# Patient Record
Sex: Female | Born: 1935
Health system: Southern US, Community
[De-identification: ages and names within clinical notes are randomized; demographics above are authoritative.]

## PROBLEM LIST (undated history)

## (undated) DIAGNOSIS — E039 Hypothyroidism, unspecified: Secondary | ICD-10-CM

## (undated) DIAGNOSIS — I10 Essential (primary) hypertension: Secondary | ICD-10-CM

## (undated) DIAGNOSIS — Z8679 Personal history of other diseases of the circulatory system: Secondary | ICD-10-CM

## (undated) DIAGNOSIS — I2510 Atherosclerotic heart disease of native coronary artery without angina pectoris: Secondary | ICD-10-CM

## (undated) DIAGNOSIS — E785 Hyperlipidemia, unspecified: Secondary | ICD-10-CM

## (undated) HISTORY — PX: INSERT / REPLACE / REMOVE PACEMAKER: SUR710

## (undated) HISTORY — PX: BREAST LUMPECTOMY: SHX2

## (undated) HISTORY — PX: CARDIAC CATHETERIZATION: SHX172

## (undated) HISTORY — PX: ABDOMINAL HYSTERECTOMY: SHX81

## (undated) HISTORY — PX: CHOLECYSTECTOMY: SHX55

---

## 2009-02-26 HISTORY — PX: OTHER SURGICAL HISTORY: SHX169

## 2014-03-04 DIAGNOSIS — E039 Hypothyroidism, unspecified: Secondary | ICD-10-CM | POA: Diagnosis not present

## 2014-03-04 DIAGNOSIS — M25552 Pain in left hip: Secondary | ICD-10-CM | POA: Diagnosis not present

## 2014-03-05 DIAGNOSIS — Z969 Presence of functional implant, unspecified: Secondary | ICD-10-CM | POA: Diagnosis not present

## 2014-03-05 DIAGNOSIS — M7062 Trochanteric bursitis, left hip: Secondary | ICD-10-CM | POA: Diagnosis not present

## 2014-03-29 DIAGNOSIS — H3531 Nonexudative age-related macular degeneration: Secondary | ICD-10-CM | POA: Diagnosis not present

## 2014-05-06 DIAGNOSIS — I440 Atrioventricular block, first degree: Secondary | ICD-10-CM | POA: Diagnosis not present

## 2014-08-06 DIAGNOSIS — Z45010 Encounter for checking and testing of cardiac pacemaker pulse generator [battery]: Secondary | ICD-10-CM | POA: Diagnosis not present

## 2014-08-06 DIAGNOSIS — I498 Other specified cardiac arrhythmias: Secondary | ICD-10-CM | POA: Diagnosis not present

## 2014-11-08 DIAGNOSIS — Z45018 Encounter for adjustment and management of other part of cardiac pacemaker: Secondary | ICD-10-CM | POA: Diagnosis not present

## 2014-11-08 DIAGNOSIS — I498 Other specified cardiac arrhythmias: Secondary | ICD-10-CM | POA: Diagnosis not present

## 2014-12-09 DIAGNOSIS — S060X9A Concussion with loss of consciousness of unspecified duration, initial encounter: Secondary | ICD-10-CM | POA: Diagnosis not present

## 2014-12-09 DIAGNOSIS — Z950 Presence of cardiac pacemaker: Secondary | ICD-10-CM | POA: Diagnosis not present

## 2014-12-09 DIAGNOSIS — W19XXXA Unspecified fall, initial encounter: Secondary | ICD-10-CM | POA: Diagnosis not present

## 2014-12-09 DIAGNOSIS — S0191XA Laceration without foreign body of unspecified part of head, initial encounter: Secondary | ICD-10-CM | POA: Diagnosis not present

## 2014-12-09 DIAGNOSIS — Z452 Encounter for adjustment and management of vascular access device: Secondary | ICD-10-CM | POA: Diagnosis not present

## 2014-12-09 DIAGNOSIS — I2510 Atherosclerotic heart disease of native coronary artery without angina pectoris: Secondary | ICD-10-CM | POA: Diagnosis not present

## 2014-12-09 DIAGNOSIS — Z79899 Other long term (current) drug therapy: Secondary | ICD-10-CM | POA: Diagnosis not present

## 2014-12-09 DIAGNOSIS — S0101XA Laceration without foreign body of scalp, initial encounter: Secondary | ICD-10-CM | POA: Diagnosis not present

## 2014-12-09 DIAGNOSIS — Z7982 Long term (current) use of aspirin: Secondary | ICD-10-CM | POA: Diagnosis not present

## 2014-12-09 DIAGNOSIS — Z043 Encounter for examination and observation following other accident: Secondary | ICD-10-CM | POA: Diagnosis not present

## 2014-12-09 DIAGNOSIS — Z041 Encounter for examination and observation following transport accident: Secondary | ICD-10-CM | POA: Diagnosis not present

## 2014-12-09 DIAGNOSIS — R55 Syncope and collapse: Secondary | ICD-10-CM | POA: Diagnosis not present

## 2014-12-09 DIAGNOSIS — Z23 Encounter for immunization: Secondary | ICD-10-CM | POA: Diagnosis not present

## 2014-12-09 DIAGNOSIS — N390 Urinary tract infection, site not specified: Secondary | ICD-10-CM | POA: Diagnosis not present

## 2014-12-10 DIAGNOSIS — W19XXXA Unspecified fall, initial encounter: Secondary | ICD-10-CM | POA: Diagnosis not present

## 2014-12-10 DIAGNOSIS — I2510 Atherosclerotic heart disease of native coronary artery without angina pectoris: Secondary | ICD-10-CM | POA: Diagnosis not present

## 2014-12-10 DIAGNOSIS — S060X9A Concussion with loss of consciousness of unspecified duration, initial encounter: Secondary | ICD-10-CM | POA: Diagnosis not present

## 2014-12-10 DIAGNOSIS — S0191XA Laceration without foreign body of unspecified part of head, initial encounter: Secondary | ICD-10-CM | POA: Diagnosis not present

## 2014-12-10 DIAGNOSIS — Z79899 Other long term (current) drug therapy: Secondary | ICD-10-CM | POA: Diagnosis not present

## 2014-12-10 DIAGNOSIS — N390 Urinary tract infection, site not specified: Secondary | ICD-10-CM | POA: Diagnosis not present

## 2014-12-10 DIAGNOSIS — Z7982 Long term (current) use of aspirin: Secondary | ICD-10-CM | POA: Diagnosis not present

## 2014-12-10 DIAGNOSIS — Z23 Encounter for immunization: Secondary | ICD-10-CM | POA: Diagnosis not present

## 2014-12-10 DIAGNOSIS — Z950 Presence of cardiac pacemaker: Secondary | ICD-10-CM | POA: Diagnosis not present

## 2014-12-10 DIAGNOSIS — R55 Syncope and collapse: Secondary | ICD-10-CM | POA: Diagnosis not present

## 2014-12-14 DIAGNOSIS — S060X9A Concussion with loss of consciousness of unspecified duration, initial encounter: Secondary | ICD-10-CM | POA: Diagnosis not present

## 2014-12-14 DIAGNOSIS — N390 Urinary tract infection, site not specified: Secondary | ICD-10-CM | POA: Diagnosis not present

## 2014-12-22 DIAGNOSIS — H353130 Nonexudative age-related macular degeneration, bilateral, stage unspecified: Secondary | ICD-10-CM | POA: Diagnosis not present

## 2015-02-07 DIAGNOSIS — Z45018 Encounter for adjustment and management of other part of cardiac pacemaker: Secondary | ICD-10-CM | POA: Diagnosis not present

## 2015-02-07 DIAGNOSIS — I498 Other specified cardiac arrhythmias: Secondary | ICD-10-CM | POA: Diagnosis not present

## 2015-02-09 DIAGNOSIS — E039 Hypothyroidism, unspecified: Secondary | ICD-10-CM | POA: Diagnosis not present

## 2015-02-09 DIAGNOSIS — Z1389 Encounter for screening for other disorder: Secondary | ICD-10-CM | POA: Diagnosis not present

## 2015-02-09 DIAGNOSIS — K2970 Gastritis, unspecified, without bleeding: Secondary | ICD-10-CM | POA: Diagnosis not present

## 2015-02-09 DIAGNOSIS — Z0000 Encounter for general adult medical examination without abnormal findings: Secondary | ICD-10-CM | POA: Diagnosis not present

## 2015-02-09 DIAGNOSIS — Z9181 History of falling: Secondary | ICD-10-CM | POA: Diagnosis not present

## 2015-05-19 DIAGNOSIS — I495 Sick sinus syndrome: Secondary | ICD-10-CM | POA: Insufficient documentation

## 2015-05-19 DIAGNOSIS — Z45018 Encounter for adjustment and management of other part of cardiac pacemaker: Secondary | ICD-10-CM | POA: Insufficient documentation

## 2015-05-19 DIAGNOSIS — R55 Syncope and collapse: Secondary | ICD-10-CM

## 2015-05-19 DIAGNOSIS — Z45010 Encounter for checking and testing of cardiac pacemaker pulse generator [battery]: Secondary | ICD-10-CM | POA: Diagnosis not present

## 2015-05-19 HISTORY — DX: Sick sinus syndrome: I49.5

## 2015-05-19 HISTORY — DX: Syncope and collapse: R55

## 2015-05-19 HISTORY — DX: Encounter for adjustment and management of other part of cardiac pacemaker: Z45.018

## 2015-06-19 DIAGNOSIS — I2510 Atherosclerotic heart disease of native coronary artery without angina pectoris: Secondary | ICD-10-CM

## 2015-06-19 DIAGNOSIS — E785 Hyperlipidemia, unspecified: Secondary | ICD-10-CM

## 2015-06-19 HISTORY — DX: Hyperlipidemia, unspecified: E78.5

## 2015-06-19 HISTORY — DX: Atherosclerotic heart disease of native coronary artery without angina pectoris: I25.10

## 2015-06-20 DIAGNOSIS — E785 Hyperlipidemia, unspecified: Secondary | ICD-10-CM | POA: Diagnosis not present

## 2015-06-20 DIAGNOSIS — I25119 Atherosclerotic heart disease of native coronary artery with unspecified angina pectoris: Secondary | ICD-10-CM | POA: Diagnosis not present

## 2015-06-20 DIAGNOSIS — I10 Essential (primary) hypertension: Secondary | ICD-10-CM | POA: Diagnosis not present

## 2015-06-20 DIAGNOSIS — R0602 Shortness of breath: Secondary | ICD-10-CM | POA: Diagnosis not present

## 2015-06-20 DIAGNOSIS — I495 Sick sinus syndrome: Secondary | ICD-10-CM | POA: Diagnosis not present

## 2015-06-20 DIAGNOSIS — Z45018 Encounter for adjustment and management of other part of cardiac pacemaker: Secondary | ICD-10-CM | POA: Diagnosis not present

## 2015-06-27 DIAGNOSIS — R0602 Shortness of breath: Secondary | ICD-10-CM | POA: Diagnosis not present

## 2015-06-27 DIAGNOSIS — I25119 Atherosclerotic heart disease of native coronary artery with unspecified angina pectoris: Secondary | ICD-10-CM | POA: Diagnosis not present

## 2015-08-08 DIAGNOSIS — Z950 Presence of cardiac pacemaker: Secondary | ICD-10-CM | POA: Diagnosis not present

## 2015-08-29 DIAGNOSIS — H353131 Nonexudative age-related macular degeneration, bilateral, early dry stage: Secondary | ICD-10-CM | POA: Diagnosis not present

## 2015-11-08 DIAGNOSIS — Z45018 Encounter for adjustment and management of other part of cardiac pacemaker: Secondary | ICD-10-CM | POA: Diagnosis not present

## 2015-11-21 DIAGNOSIS — R079 Chest pain, unspecified: Secondary | ICD-10-CM | POA: Diagnosis not present

## 2015-11-21 DIAGNOSIS — R0602 Shortness of breath: Secondary | ICD-10-CM | POA: Diagnosis not present

## 2015-11-21 DIAGNOSIS — R05 Cough: Secondary | ICD-10-CM | POA: Diagnosis not present

## 2015-11-21 DIAGNOSIS — Z45018 Encounter for adjustment and management of other part of cardiac pacemaker: Secondary | ICD-10-CM | POA: Diagnosis not present

## 2015-11-21 DIAGNOSIS — I10 Essential (primary) hypertension: Secondary | ICD-10-CM | POA: Diagnosis not present

## 2015-11-21 DIAGNOSIS — I495 Sick sinus syndrome: Secondary | ICD-10-CM | POA: Diagnosis not present

## 2015-11-21 DIAGNOSIS — I25119 Atherosclerotic heart disease of native coronary artery with unspecified angina pectoris: Secondary | ICD-10-CM | POA: Diagnosis not present

## 2015-11-21 DIAGNOSIS — E785 Hyperlipidemia, unspecified: Secondary | ICD-10-CM | POA: Diagnosis not present

## 2015-11-24 DIAGNOSIS — I25119 Atherosclerotic heart disease of native coronary artery with unspecified angina pectoris: Secondary | ICD-10-CM | POA: Diagnosis not present

## 2015-12-19 DIAGNOSIS — I10 Essential (primary) hypertension: Secondary | ICD-10-CM | POA: Diagnosis not present

## 2015-12-19 DIAGNOSIS — I25119 Atherosclerotic heart disease of native coronary artery with unspecified angina pectoris: Secondary | ICD-10-CM | POA: Diagnosis not present

## 2015-12-19 DIAGNOSIS — E785 Hyperlipidemia, unspecified: Secondary | ICD-10-CM | POA: Diagnosis not present

## 2015-12-19 DIAGNOSIS — Z45018 Encounter for adjustment and management of other part of cardiac pacemaker: Secondary | ICD-10-CM | POA: Diagnosis not present

## 2016-01-31 DIAGNOSIS — I25119 Atherosclerotic heart disease of native coronary artery with unspecified angina pectoris: Secondary | ICD-10-CM | POA: Diagnosis not present

## 2016-01-31 DIAGNOSIS — I10 Essential (primary) hypertension: Secondary | ICD-10-CM | POA: Diagnosis not present

## 2016-01-31 DIAGNOSIS — Z45018 Encounter for adjustment and management of other part of cardiac pacemaker: Secondary | ICD-10-CM | POA: Diagnosis not present

## 2016-02-13 DIAGNOSIS — E039 Hypothyroidism, unspecified: Secondary | ICD-10-CM | POA: Diagnosis not present

## 2016-02-13 DIAGNOSIS — K2970 Gastritis, unspecified, without bleeding: Secondary | ICD-10-CM | POA: Diagnosis not present

## 2016-02-13 DIAGNOSIS — Z1389 Encounter for screening for other disorder: Secondary | ICD-10-CM | POA: Diagnosis not present

## 2016-02-13 DIAGNOSIS — Z79899 Other long term (current) drug therapy: Secondary | ICD-10-CM | POA: Diagnosis not present

## 2016-02-13 DIAGNOSIS — Z0000 Encounter for general adult medical examination without abnormal findings: Secondary | ICD-10-CM | POA: Diagnosis not present

## 2016-02-13 DIAGNOSIS — Z9181 History of falling: Secondary | ICD-10-CM | POA: Diagnosis not present

## 2016-02-14 DIAGNOSIS — Z45018 Encounter for adjustment and management of other part of cardiac pacemaker: Secondary | ICD-10-CM | POA: Diagnosis not present

## 2016-03-08 DIAGNOSIS — M2540 Effusion, unspecified joint: Secondary | ICD-10-CM | POA: Diagnosis not present

## 2016-03-16 DIAGNOSIS — M1712 Unilateral primary osteoarthritis, left knee: Secondary | ICD-10-CM | POA: Diagnosis not present

## 2016-03-21 DIAGNOSIS — H353131 Nonexudative age-related macular degeneration, bilateral, early dry stage: Secondary | ICD-10-CM | POA: Diagnosis not present

## 2016-05-15 DIAGNOSIS — R531 Weakness: Secondary | ICD-10-CM | POA: Diagnosis not present

## 2016-05-17 DIAGNOSIS — Z45018 Encounter for adjustment and management of other part of cardiac pacemaker: Secondary | ICD-10-CM | POA: Diagnosis not present

## 2016-05-17 DIAGNOSIS — I495 Sick sinus syndrome: Secondary | ICD-10-CM | POA: Diagnosis not present

## 2016-06-15 DIAGNOSIS — M1712 Unilateral primary osteoarthritis, left knee: Secondary | ICD-10-CM | POA: Diagnosis not present

## 2016-08-08 DIAGNOSIS — I10 Essential (primary) hypertension: Secondary | ICD-10-CM | POA: Diagnosis not present

## 2016-08-08 DIAGNOSIS — I4430 Unspecified atrioventricular block: Secondary | ICD-10-CM | POA: Insufficient documentation

## 2016-08-08 DIAGNOSIS — I2510 Atherosclerotic heart disease of native coronary artery without angina pectoris: Secondary | ICD-10-CM | POA: Diagnosis not present

## 2016-08-08 DIAGNOSIS — I495 Sick sinus syndrome: Secondary | ICD-10-CM | POA: Diagnosis not present

## 2016-08-08 DIAGNOSIS — Z45018 Encounter for adjustment and management of other part of cardiac pacemaker: Secondary | ICD-10-CM | POA: Diagnosis not present

## 2016-08-08 DIAGNOSIS — R55 Syncope and collapse: Secondary | ICD-10-CM | POA: Diagnosis not present

## 2016-08-08 HISTORY — DX: Unspecified atrioventricular block: I44.30

## 2016-08-17 DIAGNOSIS — Z950 Presence of cardiac pacemaker: Secondary | ICD-10-CM | POA: Diagnosis not present

## 2016-09-24 DIAGNOSIS — M1712 Unilateral primary osteoarthritis, left knee: Secondary | ICD-10-CM | POA: Diagnosis not present

## 2017-01-07 DIAGNOSIS — M1712 Unilateral primary osteoarthritis, left knee: Secondary | ICD-10-CM | POA: Diagnosis not present

## 2017-01-08 ENCOUNTER — Telehealth: Payer: Self-pay

## 2017-01-08 ENCOUNTER — Other Ambulatory Visit: Payer: Self-pay

## 2017-01-08 MED ORDER — RANOLAZINE ER 500 MG PO TB12
500.0000 mg | ORAL_TABLET | Freq: Two times a day (BID) | ORAL | 11 refills | Status: DC
Start: 2017-01-08 — End: 2018-03-04

## 2017-01-08 NOTE — Telephone Encounter (Signed)
Received paper refill request on patient. Left message to return call regarding if patient will be continuing care with Dr. Bettina Gavia in [facility].

## 2017-02-13 DIAGNOSIS — Z0000 Encounter for general adult medical examination without abnormal findings: Secondary | ICD-10-CM | POA: Diagnosis not present

## 2017-02-13 DIAGNOSIS — K2970 Gastritis, unspecified, without bleeding: Secondary | ICD-10-CM | POA: Diagnosis not present

## 2017-02-13 DIAGNOSIS — Z6823 Body mass index (BMI) 23.0-23.9, adult: Secondary | ICD-10-CM | POA: Diagnosis not present

## 2017-02-13 DIAGNOSIS — Z1339 Encounter for screening examination for other mental health and behavioral disorders: Secondary | ICD-10-CM | POA: Diagnosis not present

## 2017-02-13 DIAGNOSIS — E039 Hypothyroidism, unspecified: Secondary | ICD-10-CM | POA: Diagnosis not present

## 2017-02-15 DIAGNOSIS — Z950 Presence of cardiac pacemaker: Secondary | ICD-10-CM | POA: Diagnosis not present

## 2017-03-27 DIAGNOSIS — H353131 Nonexudative age-related macular degeneration, bilateral, early dry stage: Secondary | ICD-10-CM | POA: Diagnosis not present

## 2017-04-30 DIAGNOSIS — M1712 Unilateral primary osteoarthritis, left knee: Secondary | ICD-10-CM | POA: Diagnosis not present

## 2017-07-03 DIAGNOSIS — E039 Hypothyroidism, unspecified: Secondary | ICD-10-CM | POA: Diagnosis not present

## 2017-07-03 DIAGNOSIS — I2510 Atherosclerotic heart disease of native coronary artery without angina pectoris: Secondary | ICD-10-CM | POA: Diagnosis not present

## 2017-07-03 DIAGNOSIS — M79601 Pain in right arm: Secondary | ICD-10-CM | POA: Diagnosis not present

## 2017-07-03 DIAGNOSIS — R200 Anesthesia of skin: Secondary | ICD-10-CM | POA: Diagnosis not present

## 2017-07-03 DIAGNOSIS — R5381 Other malaise: Secondary | ICD-10-CM | POA: Diagnosis not present

## 2017-07-03 DIAGNOSIS — I10 Essential (primary) hypertension: Secondary | ICD-10-CM | POA: Diagnosis not present

## 2017-07-03 DIAGNOSIS — R0789 Other chest pain: Secondary | ICD-10-CM | POA: Diagnosis not present

## 2017-07-03 DIAGNOSIS — Z66 Do not resuscitate: Secondary | ICD-10-CM | POA: Diagnosis not present

## 2017-07-03 DIAGNOSIS — R61 Generalized hyperhidrosis: Secondary | ICD-10-CM | POA: Diagnosis not present

## 2017-07-03 DIAGNOSIS — Z955 Presence of coronary angioplasty implant and graft: Secondary | ICD-10-CM | POA: Diagnosis not present

## 2017-07-03 DIAGNOSIS — R030 Elevated blood-pressure reading, without diagnosis of hypertension: Secondary | ICD-10-CM | POA: Diagnosis not present

## 2017-07-03 DIAGNOSIS — R5383 Other fatigue: Secondary | ICD-10-CM | POA: Diagnosis not present

## 2017-07-03 DIAGNOSIS — R079 Chest pain, unspecified: Secondary | ICD-10-CM | POA: Diagnosis not present

## 2017-07-03 DIAGNOSIS — Z79899 Other long term (current) drug therapy: Secondary | ICD-10-CM | POA: Diagnosis not present

## 2017-07-03 DIAGNOSIS — I25118 Atherosclerotic heart disease of native coronary artery with other forms of angina pectoris: Secondary | ICD-10-CM | POA: Diagnosis not present

## 2017-07-03 DIAGNOSIS — Z950 Presence of cardiac pacemaker: Secondary | ICD-10-CM | POA: Diagnosis not present

## 2017-07-03 DIAGNOSIS — R0602 Shortness of breath: Secondary | ICD-10-CM | POA: Diagnosis not present

## 2017-07-03 DIAGNOSIS — R072 Precordial pain: Secondary | ICD-10-CM | POA: Diagnosis not present

## 2017-07-03 DIAGNOSIS — I441 Atrioventricular block, second degree: Secondary | ICD-10-CM | POA: Diagnosis not present

## 2017-07-03 DIAGNOSIS — E785 Hyperlipidemia, unspecified: Secondary | ICD-10-CM | POA: Diagnosis not present

## 2017-07-04 DIAGNOSIS — I252 Old myocardial infarction: Secondary | ICD-10-CM | POA: Diagnosis not present

## 2017-07-04 DIAGNOSIS — I2510 Atherosclerotic heart disease of native coronary artery without angina pectoris: Secondary | ICD-10-CM | POA: Diagnosis not present

## 2017-07-04 DIAGNOSIS — I10 Essential (primary) hypertension: Secondary | ICD-10-CM | POA: Diagnosis not present

## 2017-07-04 DIAGNOSIS — M79601 Pain in right arm: Secondary | ICD-10-CM | POA: Diagnosis not present

## 2017-07-04 DIAGNOSIS — R0789 Other chest pain: Secondary | ICD-10-CM | POA: Diagnosis not present

## 2017-07-04 DIAGNOSIS — R079 Chest pain, unspecified: Secondary | ICD-10-CM | POA: Diagnosis not present

## 2017-07-05 DIAGNOSIS — R7989 Other specified abnormal findings of blood chemistry: Secondary | ICD-10-CM | POA: Diagnosis not present

## 2017-07-05 DIAGNOSIS — E039 Hypothyroidism, unspecified: Secondary | ICD-10-CM | POA: Diagnosis not present

## 2017-07-05 DIAGNOSIS — I10 Essential (primary) hypertension: Secondary | ICD-10-CM | POA: Diagnosis not present

## 2017-07-05 DIAGNOSIS — Z950 Presence of cardiac pacemaker: Secondary | ICD-10-CM | POA: Diagnosis not present

## 2017-07-08 ENCOUNTER — Other Ambulatory Visit: Payer: Self-pay

## 2017-07-08 NOTE — Patient Outreach (Signed)
[facility]) Care Management  07/08/2017  Maria Schmidt 02-Apr-1919 [id number]   Referral received. No outreach warranted at this time. Transition of Care  will be completed by primary care provider office who will refer to [facility] care management if needed.  Plan: RN CM will close case.  Jone Baseman, RN, MSN [facility] Care Management Care Management Coordinator Direct Line [phone] Toll Free: [phone]  Fax: [phone]

## 2017-07-17 DIAGNOSIS — Z950 Presence of cardiac pacemaker: Secondary | ICD-10-CM | POA: Insufficient documentation

## 2017-07-17 DIAGNOSIS — I25119 Atherosclerotic heart disease of native coronary artery with unspecified angina pectoris: Secondary | ICD-10-CM | POA: Diagnosis not present

## 2017-07-17 DIAGNOSIS — I10 Essential (primary) hypertension: Secondary | ICD-10-CM | POA: Diagnosis not present

## 2017-07-17 DIAGNOSIS — E785 Hyperlipidemia, unspecified: Secondary | ICD-10-CM | POA: Diagnosis not present

## 2017-07-17 HISTORY — DX: Presence of cardiac pacemaker: Z95.0

## 2017-08-09 DIAGNOSIS — M1712 Unilateral primary osteoarthritis, left knee: Secondary | ICD-10-CM | POA: Diagnosis not present

## 2017-08-27 ENCOUNTER — Other Ambulatory Visit: Payer: Self-pay

## 2017-08-27 ENCOUNTER — Inpatient Hospital Stay (HOSPITAL_COMMUNITY)
Admission: AD | Admit: 2017-08-27 | Discharge: 2017-08-30 | DRG: 379 | Disposition: A | Discharge status: Home Health | Payer: PPO | Source: Other Acute Inpatient Hospital | Attending: Internal Medicine | Admitting: Internal Medicine

## 2017-08-27 ENCOUNTER — Inpatient Hospital Stay (HOSPITAL_COMMUNITY): Payer: PPO

## 2017-08-27 ENCOUNTER — Encounter (HOSPITAL_COMMUNITY): Payer: Self-pay

## 2017-08-27 DIAGNOSIS — D62 Acute posthemorrhagic anemia: Secondary | ICD-10-CM | POA: Diagnosis not present

## 2017-08-27 DIAGNOSIS — D500 Iron deficiency anemia secondary to blood loss (chronic): Secondary | ICD-10-CM | POA: Diagnosis present

## 2017-08-27 DIAGNOSIS — E039 Hypothyroidism, unspecified: Secondary | ICD-10-CM | POA: Diagnosis not present

## 2017-08-27 DIAGNOSIS — Z90710 Acquired absence of both cervix and uterus: Secondary | ICD-10-CM

## 2017-08-27 DIAGNOSIS — Z955 Presence of coronary angioplasty implant and graft: Secondary | ICD-10-CM

## 2017-08-27 DIAGNOSIS — K449 Diaphragmatic hernia without obstruction or gangrene: Secondary | ICD-10-CM | POA: Diagnosis present

## 2017-08-27 DIAGNOSIS — Z950 Presence of cardiac pacemaker: Secondary | ICD-10-CM | POA: Diagnosis not present

## 2017-08-27 DIAGNOSIS — K5731 Diverticulosis of large intestine without perforation or abscess with bleeding: Principal | ICD-10-CM | POA: Diagnosis present

## 2017-08-27 DIAGNOSIS — I2510 Atherosclerotic heart disease of native coronary artery without angina pectoris: Secondary | ICD-10-CM | POA: Diagnosis not present

## 2017-08-27 DIAGNOSIS — Z888 Allergy status to other drugs, medicaments and biological substances status: Secondary | ICD-10-CM

## 2017-08-27 DIAGNOSIS — Z66 Do not resuscitate: Secondary | ICD-10-CM | POA: Diagnosis present

## 2017-08-27 DIAGNOSIS — Z9049 Acquired absence of other specified parts of digestive tract: Secondary | ICD-10-CM | POA: Diagnosis not present

## 2017-08-27 DIAGNOSIS — K922 Gastrointestinal hemorrhage, unspecified: Secondary | ICD-10-CM | POA: Diagnosis not present

## 2017-08-27 DIAGNOSIS — E785 Hyperlipidemia, unspecified: Secondary | ICD-10-CM | POA: Diagnosis not present

## 2017-08-27 DIAGNOSIS — D649 Anemia, unspecified: Secondary | ICD-10-CM | POA: Diagnosis present

## 2017-08-27 DIAGNOSIS — I10 Essential (primary) hypertension: Secondary | ICD-10-CM | POA: Diagnosis not present

## 2017-08-27 DIAGNOSIS — K219 Gastro-esophageal reflux disease without esophagitis: Secondary | ICD-10-CM | POA: Diagnosis present

## 2017-08-27 DIAGNOSIS — K921 Melena: Secondary | ICD-10-CM | POA: Diagnosis present

## 2017-08-27 DIAGNOSIS — Z8249 Family history of ischemic heart disease and other diseases of the circulatory system: Secondary | ICD-10-CM

## 2017-08-27 DIAGNOSIS — Z79899 Other long term (current) drug therapy: Secondary | ICD-10-CM

## 2017-08-27 HISTORY — DX: Essential (primary) hypertension: I10

## 2017-08-27 HISTORY — DX: Personal history of other diseases of the circulatory system: Z86.79

## 2017-08-27 HISTORY — DX: Atherosclerotic heart disease of native coronary artery without angina pectoris: I25.10

## 2017-08-27 HISTORY — DX: Gastrointestinal hemorrhage, unspecified: K92.2

## 2017-08-27 HISTORY — DX: Hyperlipidemia, unspecified: E78.5

## 2017-08-27 HISTORY — DX: Hypothyroidism, unspecified: E03.9

## 2017-08-27 LAB — COMPREHENSIVE METABOLIC PANEL
ALT: 22 U/L (ref 0–44)
AST: 23 U/L (ref 15–41)
Albumin: 3.4 g/dL — ABNORMAL LOW (ref 3.5–5.0)
Alkaline Phosphatase: 66 U/L (ref 38–126)
Anion gap: 9 (ref 5–15)
BILIRUBIN TOTAL: 0.5 mg/dL (ref 0.3–1.2)
BUN: 17 mg/dL (ref 8–23)
CO2: 26 mmol/L (ref 22–32)
CREATININE: 0.92 mg/dL (ref 0.44–1.00)
Calcium: 8.9 mg/dL (ref 8.9–10.3)
Chloride: 105 mmol/L (ref 98–111)
GFR, EST AFRICAN AMERICAN: 59 mL/min — AB (ref >60)
GFR, EST NON AFRICAN AMERICAN: 51 mL/min — AB (ref >60)
Glucose, Bld: 96 mg/dL (ref 70–99)
Potassium: 3.9 mmol/L (ref 3.5–5.1)
Sodium: 140 mmol/L (ref 135–145)
Total Protein: 6.4 g/dL — ABNORMAL LOW (ref 6.5–8.1)

## 2017-08-27 LAB — TSH: TSH: 2.629 u[IU]/mL (ref 0.350–4.500)

## 2017-08-27 LAB — CBC
HCT: 33.2 % — ABNORMAL LOW (ref 36.0–46.0)
HEMOGLOBIN: 10.9 g/dL — AB (ref 12.0–15.0)
MCH: 29.2 pg (ref 26.0–34.0)
MCHC: 32.8 g/dL (ref 30.0–36.0)
MCV: 89 fL (ref 78.0–100.0)
PLATELETS: 269 10*3/uL (ref 150–400)
RBC: 3.73 MIL/uL — AB (ref 3.87–5.11)
RDW: 13.8 % (ref 11.5–15.5)
WBC: 5.6 10*3/uL (ref 4.0–10.5)

## 2017-08-27 LAB — CBC WITH DIFFERENTIAL/PLATELET
Basophils Absolute: 0 10*3/uL (ref 0.0–0.1)
Basophils Relative: 1 %
EOS PCT: 1 %
Eosinophils Absolute: 0.1 10*3/uL (ref 0.0–0.7)
HEMATOCRIT: 38.2 % (ref 36.0–46.0)
HEMOGLOBIN: 12.8 g/dL (ref 12.0–15.0)
LYMPHS ABS: 0.9 10*3/uL (ref 0.7–4.0)
LYMPHS PCT: 14 %
MCH: 29.8 pg (ref 26.0–34.0)
MCHC: 33.5 g/dL (ref 30.0–36.0)
MCV: 88.8 fL (ref 78.0–100.0)
Monocytes Absolute: 0.8 10*3/uL (ref 0.1–1.0)
Monocytes Relative: 13 %
NEUTROS ABS: 4.5 10*3/uL (ref 1.7–7.7)
NEUTROS PCT: 71 %
Platelets: 304 10*3/uL (ref 150–400)
RBC: 4.3 MIL/uL (ref 3.87–5.11)
RDW: 13.6 % (ref 11.5–15.5)
WBC: 6.3 10*3/uL (ref 4.0–10.5)

## 2017-08-27 LAB — TYPE AND SCREEN
ABO/RH(D): O POS
ANTIBODY SCREEN: NEGATIVE

## 2017-08-27 LAB — SAMPLE TO BLOOD BANK

## 2017-08-27 LAB — ABO/RH: ABO/RH(D): O POS

## 2017-08-27 MED ORDER — PANTOPRAZOLE SODIUM 40 MG IV SOLR
40.0000 mg | Freq: Two times a day (BID) | INTRAVENOUS | Status: DC
  Administered 2017-08-27 – 2017-08-28 (×3): 40 mg via INTRAVENOUS
  Filled 2017-08-27 (×3): qty 40

## 2017-08-27 MED ORDER — IOPAMIDOL (ISOVUE-300) INJECTION 61%
INTRAVENOUS | Status: AC
  Filled 2017-08-27: qty 100

## 2017-08-27 MED ORDER — ISOSORBIDE MONONITRATE ER 30 MG PO TB24
30.0000 mg | ORAL_TABLET | Freq: Every day | ORAL | Status: DC
Start: 2017-08-27 — End: 2017-08-29
  Administered 2017-08-27 – 2017-08-28 (×2): 30 mg via ORAL
  Filled 2017-08-27 (×2): qty 1

## 2017-08-27 MED ORDER — IOPAMIDOL (ISOVUE-300) INJECTION 61%
100.0000 mL | Freq: Once | INTRAVENOUS | Status: AC | PRN
  Administered 2017-08-27: 80 mL via INTRAVENOUS

## 2017-08-27 MED ORDER — METOPROLOL TARTRATE 25 MG PO TABS
25.0000 mg | ORAL_TABLET | Freq: Every day | ORAL | Status: DC
Start: 2017-08-27 — End: 2017-08-29
  Administered 2017-08-27 – 2017-08-28 (×2): 25 mg via ORAL
  Filled 2017-08-27 (×2): qty 1

## 2017-08-27 MED ORDER — SODIUM CHLORIDE 0.9 % IV SOLN
INTRAVENOUS | Status: DC
  Administered 2017-08-27 – 2017-08-29 (×4): via INTRAVENOUS

## 2017-08-27 MED ORDER — LEVOTHYROXINE SODIUM 50 MCG PO TABS
50.0000 ug | ORAL_TABLET | Freq: Every day | ORAL | Status: DC
  Administered 2017-08-28 – 2017-08-30 (×3): 50 ug via ORAL
  Filled 2017-08-27 (×3): qty 1

## 2017-08-27 NOTE — Progress Notes (Signed)
Pt transferred from [facility] ER. PT AOx4, vitals stable. MD made aware of arrival

## 2017-08-27 NOTE — H&P (Addendum)
History and Physical    Maria Schmidt EHU:[id number] DOB: 1919/09/07  DOA: 08/27/2017 PCP: Ronita Hipps, MD  Patient coming from: [facility]  Chief Complaint: Bloody bowel movements  HPI: Maria Schmidt is a 84 y.o. female with medical history significant of CAD s/p PPM, hypothyroidism, diverticulosis, and esophageal stricture presented to [facility] on 7/2 due to blood in stools.  Patient reported that symptoms started this morning with 3 bloody bowel movement, no associated symptoms were noted.  She denies nausea, vomiting and abdominal pain.  While on the ED patient had 4 more bowel movement with dark and blood stools.  Patient reported prior to this episode she was having diarrhea for about 2 weeks but spontaneously resolved.  Up to last night bowel movements were normal.  Patient denies fever, chills, chest pain, shortness of breath and palpitations.  ED Course: [facility] ED, hemoglobin 13.9,  bloody bowel movement witnessed by EDP.  No GI coverage at [facility].  Transferred [facility] hospital for GI evaluation. Labs unremarkable.   Review of Systems:   All reviewed and negative except ones mentioned in HPI   Past Medical History:  Diagnosis Date  . Coronary artery disease   . History of second degree heart block   . Hyperlipidemia   . Hypertension   . Hypothyroidism     Past Surgical History:  Procedure Laterality Date  . ABDOMINAL HYSTERECTOMY    . BREAST LUMPECTOMY    . CARDIAC CATHETERIZATION    . CHOLECYSTECTOMY    . INSERT / REPLACE / REMOVE PACEMAKER    . Stent to LAD  2011     reports that she has never smoked. She has never used smokeless tobacco. She reports that she does not drink alcohol or use drugs.  Allergies  Allergen Reactions  . Lipitor [Atorvastatin] Itching    Family History  Problem Relation Age of Onset  . Heart attack Father   . Cancer Father     Prior to Admission medications   Medication Sig Start Date End Date Taking?  Authorizing Provider  ranolazine (RANEXA) 500 MG 12 hr tablet Take 1 tablet (500 mg total) 2 (two) times daily by mouth. 01/08/17 09/23/20  Richardo Priest, MD    Physical Exam: Vitals:   08/27/17 1301  BP: (!) 137/58  Pulse: 79  Resp: 18  Temp: 98 F (36.7 C)  TempSrc: Oral  SpO2: 96%  Weight: 57 kg (125 lb 10.6 oz)  Height: 5\' 1"  (1.549 m)     Constitutional: NAD, calm, comfortable Eyes: PERRL, lids and conjunctivae normal pale ENMT: Mucous membranes are dry. Posterior pharynx clear of any exudate or lesions. Neck: normal, supple, no masses, no thyromegaly Respiratory: clear to auscultation bilaterally, no wheezing, no crackles. Normal respiratory effort. Cardiovascular: Regular rate and rhythm, no murmurs / rubs / gallops. No extremity edema. 2+ pedal pulses.  Abdomen: Soft, nondistended, mild b/l lower quadrant tenderness.  Positive BS, no organomegaly Musculoskeletal: no clubbing / cyanosis. No joint deformity upper and lower extremities. Good ROM  Skin: no rashes, lesions, ulcers. No induration Neurologic: CN 2-12 grossly intact. Strength 5/5 in all 4.  Psychiatric:  Alert and oriented x 3. Normal mood.   Labs on Admission: I have personally reviewed following labs and imaging studies  CBC: No results for input(s): WBC, NEUTROABS, HGB, HCT, MCV, PLT in the last 168 hours. Basic Metabolic Panel: No results for input(s): NA, K, CL, CO2, GLUCOSE, BUN, CREATININE, CALCIUM, MG, PHOS in the last  168 hours. GFR: CrCl cannot be calculated (No order found.). Liver Function Tests: No results for input(s): AST, ALT, ALKPHOS, BILITOT, PROT, ALBUMIN in the last 168 hours. No results for input(s): LIPASE, AMYLASE in the last 168 hours. No results for input(s): AMMONIA in the last 168 hours. Coagulation Profile: No results for input(s): INR, PROTIME in the last 168 hours. Cardiac Enzymes: No results for input(s): CKTOTAL, CKMB, CKMBINDEX, TROPONINI in the last 168 hours. BNP (last  3 results) No results for input(s): PROBNP in the last 8760 hours. HbA1C: No results for input(s): HGBA1C in the last 72 hours. CBG: No results for input(s): GLUCAP in the last 168 hours. Lipid Profile: No results for input(s): CHOL, HDL, LDLCALC, TRIG, CHOLHDL, LDLDIRECT in the last 72 hours. Thyroid Function Tests: No results for input(s): TSH, T4TOTAL, FREET4, T3FREE, THYROIDAB in the last 72 hours. Anemia Panel: No results for input(s): VITAMINB12, FOLATE, FERRITIN, TIBC, IRON, RETICCTPCT in the last 72 hours. Urine analysis: No results found for: COLORURINE, APPEARANCEUR, LABSPEC, PHURINE, GLUCOSEU, HGBUR, BILIRUBINUR, KETONESUR, PROTEINUR, UROBILINOGEN, NITRITE, LEUKOCYTESUR Sepsis Labs: !!!!!!!!!!!!!!!!!!!!!!!!!!!!!!!!!!!!!!!!!!!! @LABRCNTIP (procalcitonin:4,lacticidven:4) )No results found for this or any previous visit (from the past 240 hour(s)).   Radiological Exams on Admission: No results found.  EKG: Independently reviewed.  Normal sinus rhythm  Assessment/Plan: GI bleed Unclear etiology at this time, will admit to MedSurg, obtain GI consult.  Given tenderness findings on exam will obtain CT scan as patient has history of diverticulosis.  Start IV fluids, keep n.p.o.  Type and screen.  Get FOBT.  Transfuse if hemoglobin less than 8.  Initial hemoglobin at [facility] ED was 13.9, here down to 12.8, serial CBC every 6 hrs.  Place it on IV Protonix twice daily  CAD/hypertension Hold aspirin, continue home medications metoprolol, Imdur.  Monitor BP closely  Hypothyroidism Continue Synthroid, check TSH  DVT prophylaxis: SCDs Code Status: Full code Family Communication: None bedside Disposition Plan: Anticipate discharge to previous home environment.  Consults called: GI - [place]  Admission status: MedSurg/inpatient  Chipper Oman MD [facility] Pager: Text Page via [email or web address]  [phone]  If 7PM-7AM, please contact night-coverage [email or web address] Password  [email or web address]  08/27/2017, 1:58 PM

## 2017-08-27 NOTE — Consult Note (Addendum)
________________________________________________________________________  Maria Schmidt GI MD note:  I personally examined the patient, reviewed the data and agree with the assessment and plan described below. Lower GI bleeding, presumed diverticular. She's been HD stable.  Hb this afternoon was 10.8 and so no need for blood transfusion at this point.  Clinically bleeding is stopping. Safest to watch another 24hours and if no further bleeding she is safe for d/c tomorrow.  If the bleeding recurs, she will likely need colonoscopy to attempt to localize and stop the bleeding. Clears for now, can advance tomorrow AM if no bleeding.   Does not need IV PPI, I will change to her usual once daily dosing.  Will follow along.   Owens Loffler, MD [facility] Pager [phone]      Referring Provider: [facility]   Primary Care Physician:  Ronita Hipps, MD Primary Gastroenterologist: Dr. Lyndel Safe  Reason for Consultation:  GI bleed    ASSESSMENT AND PLAN:    82. 84 yo female with hematochezia. She has been having loose stools and intermittent diffuse abdominal discomfort for 2-3 weeks though hematochezia may be unrelated. Suspect diverticular hemorrhage.  -patient gives a history of lower GI bleeding ~ 6  Years ago evaluated by Dr. Lyndel Safe in [place], [place]. I will try and get those records. Given advanced age, co-morbid conditions it would be ideal to avoid colonoscopy. If continues to bleed will consider tagged scan or CTA.    Addendum:  Colonoscopy / EGD reports faxed to floor:   EGD with biopsies done Apri 2013 for evaluation of gastrointestinal bleeding.  Esophagus was tortuous but normal.  There was a wide open distal esophageal stricture.  Scope easily passed beyond the stricture.  There was a large hiatal hernia extending from 33 cm to 40 cm.  There were 3 small Cameron erosions without active bleeding and mild antral gastritis.  Multiple biopsies taken. Small bowel biopsy  unremarkable.    Colonoscopy for April 2013 for heme positive stools and anemia.  Exam was complete, the terminal ileum could not be intubated despite multiple attempts.  Quality of bowel prep unknown.  No active bleeding.  No masses nor polyps.  There were multiple diverticula in the sigmoid colon and in the descending colon giving a Swiss cheese appearance.  There was evidence of luminal narrowing consistent with muscular hypertrophy.  There were scattered diverticula in the ascending colon and in the transverse colon.  Retroflexed examination of the rectum revealed small internal hemorrhoids.  2. CAD / Stents / daily ASA  3. GERD. Takes daily PPI. Asymptomatic  4. Large hiatal hernia on CXR. Asymptomatic    HPI: [name] is a 84 y.o. female with CAD /history of stents, pacemaker, GERD on daily PPI. She has been having loose stool and diffuse abdominal discomfort for 2-3 weeks. Patient had not noticed any blood in stool until yesterday when she passed an "enormous amount" of dark and bright red blood. She had a couple more episodes of bleeding and then presented to [facility] ED this am where she apparently continued to have hematochezia. Felt a little lightheaded earlier today. No chest pain, no SOB.  Hemodynamically stable in ED. She takes a daily ASA, no other NSAIDs /blood thinners. She does take iron but stopped it a couple of weeks ago when having GI distress. She may have taken an iron pill yesterday however .She gives a history of a GI bleed approximately 6 years ago, around time cardiac stent placed, and says Dr. Garrel Ridgel did  a colonoscopy and stopped the bleeding.   ED: WBC 7.3, hgb 13.9, MCV 90, platelets normal.  INR 1.0 BUN 18 Liver chemistries normal.  CXR - large hiatal henia.    Repeat hgb ~ 2pm was 12.8.   Past Medical History:  Diagnosis Date  . Coronary artery disease   . History of second degree heart block   . Hyperlipidemia   . Hypertension   . Hypothyroidism      Past Surgical History:  Procedure Laterality Date  . ABDOMINAL HYSTERECTOMY    . BREAST LUMPECTOMY    . CARDIAC CATHETERIZATION    . CHOLECYSTECTOMY    . INSERT / REPLACE / REMOVE PACEMAKER    . Stent to LAD  2011    Prior to Admission medications   Medication Sig Start Date End Date Taking? Authorizing Provider  aspirin EC 81 MG tablet Take 81 mg by mouth daily.    [provider]  iron polysaccharides (NIFEREX) 150 MG capsule Take 150 mg by mouth daily.    [provider]  isosorbide mononitrate (IMDUR) 30 MG 24 hr tablet Take 30 mg by mouth daily. 07/05/17   [provider]  levothyroxine (SYNTHROID, LEVOTHROID) 50 MCG tablet Take 50 mcg by mouth daily. 08/20/17   [provider]  metoprolol tartrate (LOPRESSOR) 25 MG tablet Take 25 mg by mouth daily. 07/05/17   [provider]  Multiple Vitamin (MULTI-VITAMINS) TABS Take by mouth.    [provider]  Omega-3 1000 MG CAPS Take by mouth.    [provider]  pantoprazole (PROTONIX) 40 MG tablet Take 40 mg by mouth daily. 05/25/17   [provider]  ranolazine (RANEXA) 500 MG 12 hr tablet Take 1 tablet (500 mg total) 2 (two) times daily by mouth. 01/08/17 09/23/20  Richardo Priest, MD    Current Facility-Administered Medications  Medication Dose Route Frequency Provider Last Rate Last Dose  . 0.9 %  sodium chloride infusion   Intravenous Continuous Patrecia Pour, Christean Grief, MD      . isosorbide mononitrate (IMDUR) 24 hr tablet 30 mg  30 mg Oral Daily Patrecia Pour, Christean Grief, MD      . Derrill Memo ON 08/28/2017] levothyroxine (SYNTHROID, LEVOTHROID) tablet 50 mcg  50 mcg Oral QAC breakfast Patrecia Pour, Christean Grief, MD      . metoprolol tartrate (LOPRESSOR) tablet 25 mg  25 mg Oral Daily Patrecia Pour, Christean Grief, MD      . pantoprazole (PROTONIX) injection 40 mg  40 mg Intravenous Q12H Patrecia Pour, Christean Grief, MD        Allergies as of 08/27/2017 - Review Complete 08/27/2017  Allergen Reaction  Noted  . Lipitor [atorvastatin] Itching 08/27/2017    Family History  Problem Relation Age of Onset  . Heart attack Father   . Cancer Father     Social History   Socioeconomic History  . Marital status: Widowed    Spouse name: Not on file  . Number of children: Not on file  . Years of education: Not on file  . Highest education level: Not on file  Occupational History  . Not on file  Social Needs  . Financial resource strain: Not on file  . Food insecurity:    Worry: Not on file    Inability: Not on file  . Transportation needs:    Medical: Not on file    Non-medical: Not on file  Tobacco Use  . Smoking status: Never Smoker  . Smokeless tobacco: Never Used  Substance  and Sexual Activity  . Alcohol use: Never    Frequency: Never  . Drug use: Never  . Sexual activity: Not Currently  Lifestyle  . Physical activity:    Days per week: Not on file    Minutes per session: Not on file  . Stress: Not on file  Relationships  . Social connections:    Talks on phone: Not on file    Gets together: Not on file    Attends religious service: Not on file    Active member of club or organization: Not on file    Attends meetings of clubs or organizations: Not on file    Relationship status: Not on file  . Intimate partner violence:    Fear of current or ex partner: Not on file    Emotionally abused: Not on file    Physically abused: Not on file    Forced sexual activity: Not on file  Other Topics Concern  . Not on file  Social History Narrative  . Not on file    Review of Systems: Mild weight loss. All other systems reviewed and negative except where noted in HPI.  Physical Exam: Vital signs in last 24 hours: Temp:  [98 F (36.7 C)] 98 F (36.7 C) (07/02 1301) Pulse Rate:  [79] 79 (07/02 1301) Resp:  [18] 18 (07/02 1301) BP: (137)/(58) 137/58 (07/02 1301) SpO2:  [96 %] 96 % (07/02 1301) Weight:  [125 lb 10.6 oz (57 kg)] 125 lb 10.6 oz (57 kg) (07/02 1301) Last BM  Date: 08/27/17(bloody stools) General:   Alert, thin frail female in NAD Psych:  Pleasant, cooperative. Normal mood and affect. Eyes:  Pupils equal, sclera clear, no icterus.   Conjunctiva pink. Ears:  Normal auditory acuity. Nose:  No deformity, discharge,  or lesions. Neck:  Supple; no masses Lungs:  Clear throughout to auscultation.   No wheezes, crackles, or rhonchi.  Heart:  Regular rate and rhythm; no murmurs, no edema Abdomen:  Soft, non-distended, nontender, BS active, no palp mass    Rectal: no external lesions. No masses felt on DRE. Fresh blood in vault Rectal:  Deferred  Msk:  Symmetrical without gross deformities. . Neurologic:  Alert and  oriented x4;  grossly normal neurologically. Skin:  Intact without significant lesions or rashes..   Intake/Output from previous day: No intake/output data recorded. Intake/Output this shift: No intake/output data recorded.  Lab Results: Recent Labs    08/27/17 1410  WBC 6.3  HGB 12.8  HCT 38.2  PLT 304   BMET Recent Labs    08/27/17 1410  NA 140  K 3.9  CL 105  CO2 26  GLUCOSE 96  BUN 17  CREATININE 0.92  CALCIUM 8.9   LFT Recent Labs    08/27/17 1410  PROT 6.4*  ALBUMIN 3.4*  AST 23  ALT 22  ALKPHOS 66  BILITOT 0.5   Tye Savoy, NP-C @  08/27/2017, 3:05 PM

## 2017-08-28 DIAGNOSIS — K922 Gastrointestinal hemorrhage, unspecified: Secondary | ICD-10-CM

## 2017-08-28 LAB — CBC
HCT: 34.5 % — ABNORMAL LOW (ref 36.0–46.0)
HEMATOCRIT: 33.3 % — AB (ref 36.0–46.0)
HEMATOCRIT: 32.5 % — AB (ref 36.0–46.0)
HEMOGLOBIN: 11.1 g/dL — AB (ref 12.0–15.0)
HEMOGLOBIN: 10.8 g/dL — AB (ref 12.0–15.0)
Hemoglobin: 11.4 g/dL — ABNORMAL LOW (ref 12.0–15.0)
MCH: 29.7 pg (ref 26.0–34.0)
MCH: 29.8 pg (ref 26.0–34.0)
MCH: 29.8 pg (ref 26.0–34.0)
MCHC: 33 g/dL (ref 30.0–36.0)
MCHC: 33.3 g/dL (ref 30.0–36.0)
MCHC: 33.2 g/dL (ref 30.0–36.0)
MCV: 89.8 fL (ref 78.0–100.0)
MCV: 89.3 fL (ref 78.0–100.0)
MCV: 89.5 fL (ref 78.0–100.0)
PLATELETS: 252 10*3/uL (ref 150–400)
Platelets: 272 10*3/uL (ref 150–400)
Platelets: 256 10*3/uL (ref 150–400)
RBC: 3.84 MIL/uL — AB (ref 3.87–5.11)
RBC: 3.73 MIL/uL — AB (ref 3.87–5.11)
RBC: 3.63 MIL/uL — AB (ref 3.87–5.11)
RDW: 13.7 % (ref 11.5–15.5)
RDW: 13.7 % (ref 11.5–15.5)
RDW: 13.7 % (ref 11.5–15.5)
WBC: 5.5 10*3/uL (ref 4.0–10.5)
WBC: 5.7 10*3/uL (ref 4.0–10.5)
WBC: 6.3 10*3/uL (ref 4.0–10.5)

## 2017-08-28 MED ORDER — ZOLPIDEM TARTRATE 5 MG PO TABS
5.0000 mg | ORAL_TABLET | Freq: Once | ORAL | Status: AC
  Administered 2017-08-28: 5 mg via ORAL
  Filled 2017-08-28: qty 1

## 2017-08-28 MED ORDER — PANTOPRAZOLE SODIUM 40 MG PO TBEC
40.0000 mg | DELAYED_RELEASE_TABLET | Freq: Every day | ORAL | Status: DC
  Administered 2017-08-29 – 2017-08-30 (×2): 40 mg via ORAL
  Filled 2017-08-28 (×2): qty 1

## 2017-08-28 NOTE — Progress Notes (Signed)
PROGRESS NOTE    Maria Schmidt  KAJ:[id number] DOB: 1919/04/16 DOA: 08/27/2017 PCP: Ronita Hipps, MD    Brief Narrative: Maria Schmidt is a 84 y.o. female with medical history significant of CAD s/p PPM, hypothyroidism, diverticulosis, and esophageal stricture presented to [facility] on 7/2 due to blood in stools  She denies nausea, vomiting and abdominal pain.  [facility] ED, hemoglobin 13.9,  bloody bowel movement witnessed by EDP.  No GI coverage at [facility].  Transferred [facility] hospital for GI evaluation. Labs unremarkable.   Her hemoglobin dropped to 11 but stabilized. GI consulted and awaiting recommendations .  Currently no bloody bowel movements.  Started her on clears.     Assessment & Plan:   Active Problems:   GI bleed  ? Lower GI bleed : Appears to have resolved.  No nausea or vomiting. Or abdominal pain.  Hemoglobin stabilized at 11.  No more bloody BM's.  Resume PPI.    Hypertension: Well controlled.   Hypothyroidism: Resume synthroid.      DVT prophylaxis:scd's Code Status: DNR Family Communication: none at bedside.  Disposition Plan: pending GI evaluation.    Consultants:   Gastroenterology Velora Heckler    Procedures: none.    Antimicrobials: none.    Subjective: She reports not having any bloody bowel movements.  No nausea, vomiting or abdominal pain.   Objective: Vitals:   08/27/17 1650 08/27/17 2101 08/28/17 0651 08/28/17 0908  BP: (!) 125/47 (!) 109/59 (!) 109/58 (!) 120/59  Pulse: 70 73 69 69  Resp: 16 18 18    Temp:  98 F (36.7 C) 98 F (36.7 C)   TempSrc:  Oral    SpO2: 98% 99% 96%   Weight:      Height:        Intake/Output Summary (Last 24 hours) at 08/28/2017 1338 Last data filed at 08/28/2017 0500 Gross per 24 hour  Intake 1275 ml  Output 350 ml  Net 925 ml   Filed Weights   08/27/17 1301  Weight: 57 kg (125 lb 10.6 oz)    Examination:  General exam: Appears calm and comfortable  Respiratory system:  Clear to auscultation. Respiratory effort normal. Cardiovascular system: S1 & S2 heard, RRR. No JVD, murmurs, rubs, gallops or clicks. No pedal edema. Gastrointestinal system: Abdomen is nondistended, soft and nontender. No organomegaly or masses felt. Normal bowel sounds heard. Central nervous system: Alert and oriented. No focal neurological deficits. Extremities: Symmetric 5 x 5 power. Skin: No rashes, lesions or ulcers Psychiatry: Judgement and insight appear normal. Mood & affect appropriate.     Data Reviewed: I have personally reviewed following labs and imaging studies  CBC: Recent Labs  Lab 08/27/17 1410 08/27/17 1946 08/28/17 0145 08/28/17 0804  WBC 6.3 5.6 5.5 5.7  NEUTROABS 4.5  --   --   --   HGB 12.8 10.9* 11.4* 11.1*  HCT 38.2 33.2* 34.5* 33.3*  MCV 88.8 89.0 89.8 89.3  PLT 304 269 252 035   Basic Metabolic Panel: Recent Labs  Lab 08/27/17 1410  NA 140  K 3.9  CL 105  CO2 26  GLUCOSE 96  BUN 17  CREATININE 0.92  CALCIUM 8.9   GFR: Estimated Creatinine Clearance: 27.6 mL/min (by C-G formula based on SCr of 0.92 mg/dL). Liver Function Tests: Recent Labs  Lab 08/27/17 1410  AST 23  ALT 22  ALKPHOS 66  BILITOT 0.5  PROT 6.4*  ALBUMIN 3.4*   No results for input(s): LIPASE, AMYLASE in  the last 168 hours. No results for input(s): AMMONIA in the last 168 hours. Coagulation Profile: No results for input(s): INR, PROTIME in the last 168 hours. Cardiac Enzymes: No results for input(s): CKTOTAL, CKMB, CKMBINDEX, TROPONINI in the last 168 hours. BNP (last 3 results) No results for input(s): PROBNP in the last 8760 hours. HbA1C: No results for input(s): HGBA1C in the last 72 hours. CBG: No results for input(s): GLUCAP in the last 168 hours. Lipid Profile: No results for input(s): CHOL, HDL, LDLCALC, TRIG, CHOLHDL, LDLDIRECT in the last 72 hours. Thyroid Function Tests: Recent Labs    08/27/17 1410  TSH 2.629   Anemia Panel: No results for  input(s): VITAMINB12, FOLATE, FERRITIN, TIBC, IRON, RETICCTPCT in the last 72 hours. Sepsis Labs: No results for input(s): PROCALCITON, LATICACIDVEN in the last 168 hours.  No results found for this or any previous visit (from the past 240 hour(s)).       Radiology Studies: Ct Abdomen Pelvis W Contrast  Result Date: 08/27/2017 CLINICAL DATA:  84 year old female with acute RIGHT-sided abdominal pain. EXAM: CT ABDOMEN AND PELVIS WITH CONTRAST TECHNIQUE: Multidetector CT imaging of the abdomen and pelvis was performed using the standard protocol following bolus administration of intravenous contrast. CONTRAST:  41mL ISOVUE-300 IOPAMIDOL (ISOVUE-300) INJECTION 61% COMPARISON:  None. FINDINGS: Lower chest: No acute abnormality.  Pacemaker leads noted. Hepatobiliary: The liver is unremarkable. Patient is status post cholecystectomy. No biliary dilatation. Pancreas: Unremarkable Spleen: Unremarkable Adrenals/Urinary Tract: The kidneys, adrenal glands and bladder are unremarkable except for bilateral renal cysts. Stomach/Bowel: A large hiatal hernia is noted. There is no evidence of bowel obstruction, definite bowel wall thickening or inflammatory changes. Colonic diverticulosis noted without diverticulitis. Vascular/Lymphatic: Aortic atherosclerosis. No enlarged abdominal or pelvic lymph nodes. Reproductive: Status post hysterectomy. No adnexal masses. Other: No ascites, abscess or pneumoperitoneum. Musculoskeletal: No acute or suspicious bony abnormalities. Multilevel degenerative disc disease, spondylosis and facet arthropathy within the lumbar spine noted. IMPRESSION: 1. No acute abnormality 2. Large hiatal hernia. 3. Aortic Atherosclerosis (ICD10-I70.0). Electronically Signed   By: Margarette Canada M.D.   On: 08/27/2017 19:22        Scheduled Meds: . isosorbide mononitrate  30 mg Oral Daily  . levothyroxine  50 mcg Oral QAC breakfast  . metoprolol tartrate  25 mg Oral Daily  . pantoprazole (PROTONIX)  IV  40 mg Intravenous Q12H   Continuous Infusions: . sodium chloride 75 mL/hr at 08/28/17 0531     LOS: 1 day    Time spent: 30 minutes.     Hosie Poisson, MD [facility] Pager [phone]   If 7PM-7AM, please contact night-coverage [email or web address] Password [email or web address] 08/28/2017, 1:38 PM

## 2017-08-29 DIAGNOSIS — E039 Hypothyroidism, unspecified: Secondary | ICD-10-CM | POA: Diagnosis present

## 2017-08-29 DIAGNOSIS — I10 Essential (primary) hypertension: Secondary | ICD-10-CM

## 2017-08-29 DIAGNOSIS — D649 Anemia, unspecified: Secondary | ICD-10-CM | POA: Diagnosis present

## 2017-08-29 HISTORY — DX: Essential (primary) hypertension: I10

## 2017-08-29 LAB — CBC
HEMATOCRIT: 35.4 % — AB (ref 36.0–46.0)
HEMOGLOBIN: 11.5 g/dL — AB (ref 12.0–15.0)
MCH: 29 pg (ref 26.0–34.0)
MCHC: 32.5 g/dL (ref 30.0–36.0)
MCV: 89.4 fL (ref 78.0–100.0)
Platelets: 297 10*3/uL (ref 150–400)
RBC: 3.96 MIL/uL (ref 3.87–5.11)
RDW: 13.7 % (ref 11.5–15.5)
WBC: 6 10*3/uL (ref 4.0–10.5)

## 2017-08-29 LAB — HEMOGLOBIN AND HEMATOCRIT, BLOOD
HEMATOCRIT: 34.4 % — AB (ref 36.0–46.0)
HEMOGLOBIN: 11.3 g/dL — AB (ref 12.0–15.0)

## 2017-08-29 NOTE — Progress Notes (Addendum)
Patient ID: Maria Schmidt, female   DOB: Apr 05, 1919, 84 y.o.   MRN: [id number]    Progress Note   Subjective    In good spirits, no c/o abdominal pain or nausea- had BM yesterday afternoon - bloody , then this am one BM with dark old blood , no clots etc, small amt   HGB 11.5 stable   Objective   Vital signs in last 24 hours: Temp:  [98 F (36.7 C)-98.3 F (36.8 C)] 98 F (36.7 C) (07/04 0507) Pulse Rate:  [69-71] 70 (07/04 0507) Resp:  [16-18] 18 (07/04 0507) BP: (99-148)/(53-88) 148/88 (07/04 0507) SpO2:  [98 %] 98 % (07/04 0507) Last BM Date: 08/28/17 General:  elderly  white female in NAD Heart:  Regular rate and rhythm; no murmurs Lungs: Respirations even and unlabored, lungs CTA bilaterally Abdomen:  Soft, nontender and nondistended. Normal bowel sounds. Extremities:  Without edema. Neurologic:  Alert and oriented,  grossly normal neurologically. Psych:  Cooperative. Normal mood and affect.  Lab Results: Recent Labs    08/28/17 0804 08/28/17 1344 08/29/17 0439  WBC 5.7 6.3 6.0  HGB 11.1* 10.8* 11.5*  HCT 33.3* 32.5* 35.4*  PLT 272 256 297   BMET Recent Labs    08/27/17 1410  NA 140  K 3.9  CL 105  CO2 26  GLUCOSE 96  BUN 17  CREATININE 0.92  CALCIUM 8.9   LFT Recent Labs    08/27/17 1410  PROT 6.4*  ALBUMIN 3.4*  AST 23  ALT 22  ALKPHOS 66  BILITOT 0.5      Assessment / Plan:     #1 84 yo female with acute diverticular bleed - resolving - suspect passing old blood , and not actively bleeding  Will advance diet to regular   Continue serial hgbs - observe one more day  to assure bleeding has completely stopped No plans for Colonoscopy at this point as won't change our management    Contact  Maria Schmidt, P.A.-C               [phone]     ________________________________________________________________________  Maria Schmidt GI MD note:  I personally examined the patient, reviewed the data and agree with the assessment and plan  described above.  She's up and around the halls walking with PT, walker.  Bleeding seems to have stopped. If no recurrent bleeding then OK for d/c tomorrow.   Maria Loffler, MD [facility] Gastroenter[facility] Pager [phone]

## 2017-08-29 NOTE — Evaluation (Signed)
Physical Therapy Evaluation Patient Details Name: Maria Schmidt MRN: [id number] DOB: Jan 12, 1920 Today's Date: 08/29/2017   History of Present Illness  84 yo female admitted with GI bleed. Hx of CAD, pacemaker, hypothyroidism, bowel/bladder incontinence  Clinical Impression  On eval, pt was Min guard assist for mobility. She walked ~150 feet with a RW. Pt c/o mild lightheadedness and dyspnea with activity. She is likely a little weaker than baseline due to several days in bed during this hospital stay. Will follow and progress activity as tolerated. Will recommend HHPT f/u to facilitate safe transition back to home.     Follow Up Recommendations Home health PT(if pt is agreeable)    Equipment Recommendations  None recommended by PT    Recommendations for Other Services       Precautions / Restrictions Precautions Precautions: Fall Restrictions Weight Bearing Restrictions: No      Mobility  Bed Mobility Overal bed mobility: Needs Assistance Bed Mobility: Supine to Sit     Supine to sit: Supervision     General bed mobility comments: for safety, lines. Increased time.   Transfers Overall transfer level: Needs assistance Equipment used: Rolling walker (2 wheeled) Transfers: Sit to/from Stand Sit to Stand: Min guard;From elevated surface         General transfer comment: close guard for safety. Increased time and effort to rise from a low surface.   Ambulation/Gait Ambulation/Gait assistance: Min guard Gait Distance (Feet): 150 Feet Assistive device: Rolling walker (2 wheeled) Gait Pattern/deviations: Step-through pattern;Decreased stride length     General Gait Details: close guard for safety. Pt c/o mild dyspnea and lightheadedness.   Stairs            Wheelchair Mobility    Modified Rankin (Stroke Patients Only)       Balance Overall balance assessment: Needs assistance         Standing balance support: Bilateral upper extremity  supported Standing balance-Leahy Scale: Poor Standing balance comment: requires RW for support                             Pertinent Vitals/Pain Pain Assessment: Faces Faces Pain Scale: Hurts a little bit Pain Location: abdomen Pain Descriptors / Indicators: Sore Pain Intervention(s): Monitored during session    Home Living Family/patient expects to be discharged to:: Private residence Living Arrangements: Alone   Type of Home: House Home Access: Stairs to enter   [employer] of Steps: 1(post nearby) Home Layout: One level Home Equipment: (3 wheeled walker)      Prior Function Level of Independence: Needs assistance   Gait / Transfers Assistance Needed: uses 3 wheeled walker     Comments: friends help with grocery shopping. still drives a very small amount (only if absolutely necessary)     Hand Dominance        Extremity/Trunk Assessment   Upper Extremity Assessment Upper Extremity Assessment: Overall WFL for tasks assessed    Lower Extremity Assessment Lower Extremity Assessment: Generalized weakness    Cervical / Trunk Assessment Cervical / Trunk Assessment: Normal  Communication   Communication: No difficulties  Cognition Arousal/Alertness: Awake/alert Behavior During Therapy: WFL for tasks assessed/performed Overall Cognitive Status: Within Functional Limits for tasks assessed                                        General  Comments      Exercises     Assessment/Plan    PT Assessment Patient needs continued PT services  PT Problem List Decreased strength;Decreased mobility;Decreased balance       PT Treatment Interventions DME instruction;Gait training;Functional mobility training;Therapeutic activities;Balance training;Patient/family education;Therapeutic exercise    PT Goals (Current goals can be found in the Care Plan section)  Acute Rehab PT Goals Patient Stated Goal: home. regain PLOF.  PT Goal  Formulation: With patient Time For Goal Achievement: 09/12/17 Potential to Achieve Goals: Good    Frequency Min 3X/week   Barriers to discharge        Co-evaluation               AM-PAC PT "6 Clicks" Daily Activity  Outcome Measure Difficulty turning over in bed (including adjusting bedclothes, sheets and blankets)?: None Difficulty moving from lying on back to sitting on the side of the bed? : None Difficulty sitting down on and standing up from a chair with arms (e.g., wheelchair, bedside commode, etc,.)?: A Little Help needed moving to and from a bed to chair (including a wheelchair)?: A Little Help needed walking in hospital room?: A Little Help needed climbing 3-5 steps with a railing? : A Little 6 Click Score: 20    End of Session Equipment Utilized During Treatment: Gait belt Activity Tolerance: Patient tolerated treatment well Patient left: in chair;with call bell/phone within reach;with chair alarm set   PT Visit Diagnosis: Muscle weakness (generalized) (M62.81);Unsteadiness on feet (R26.81);Difficulty in walking, not elsewhere classified (R26.2)    Time: 7014-1030 PT Time Calculation (min) (ACUTE ONLY): 27 min   Charges:   PT Evaluation $PT Eval Moderate Complexity: 1 Mod PT Treatments $Gait Training: 8-22 mins   PT G Codes:          Weston Anna, MPT Pager: [phone]

## 2017-08-29 NOTE — Progress Notes (Signed)
Dr. Karleen Hampshire made aware via phone of brief episode of "blackout" reported by pt around 10 am this am while sitting in recliner. No other symptoms or complaints since. VSS with BP 115/56 at 1000. BP @ 1045 115/50. Pt reluctant to take BP meds ordered. See new order to discontinue bp medications.

## 2017-08-29 NOTE — Progress Notes (Signed)
Pt remains asymptomatic, resting quietly in bed.

## 2017-08-29 NOTE — Progress Notes (Signed)
PROGRESS NOTE    [name]  DJM:[id number] DOB: 04-16-1919 DOA: 08/27/2017 PCP: Ronita Hipps, MD    Brief Narrative: Maria Schmidt is a 84 y.o. female with medical history significant of CAD s/p PPM, hypothyroidism, diverticulosis, and esophageal stricture presented to [facility] on 7/2 due to blood in stools  She denies nausea, vomiting and abdominal pain.  [facility] ED, hemoglobin 13.9,  bloody bowel movement witnessed by EDP.  No GI coverage at [facility].  Transferred [facility] hospital for GI evaluation. Labs unremarkable.   Her hemoglobin dropped to 11 but stabilized. GI consulted and awaiting recommendations .  Currently no bloody bowel movements.  Started her on clears advanced to regular today.      Assessment & Plan:   Active Problems:   GI bleed  ? Lower GI bleed : possibly diverticular.  Appears to have resolved. One small BM this am with some blodd.  No nausea or vomiting. Or abdominal pain.  Hemoglobin stabilized at 11.  Resume PPI.  GI consulted, appreciate the recommendations.    Hypertension: Well controlled.   Hypothyroidism: Resume synthroid.      DVT prophylaxis:scd's Code Status: DNR Family Communication: none at bedside.  Disposition Plan: pending GI evaluation.    Consultants:   Gastroenterology Velora Heckler    Procedures: none.    Antimicrobials: none.    Subjective: One BM this am, with some blood. No nausea, vomiting or abd pain.  PT REPORTS SOME DIZZINESS ON WALKING.   Objective: Vitals:   08/28/17 2117 08/29/17 0507 08/29/17 1000 08/29/17 1045  BP: 132/73 (!) 148/88 (!) 115/56 (!) 115/50  Pulse: 71 70 70 72  Resp: 16 18    Temp: 98.3 F (36.8 C) 98 F (36.7 C)    TempSrc: Oral Oral    SpO2: 98% 98%    Weight:      Height:        Intake/Output Summary (Last 24 hours) at 08/29/2017 1329 Last data filed at 08/29/2017 1000 Gross per 24 hour  Intake 3105 ml  Output 1075 ml  Net 2030 ml   Filed Weights   08/27/17 1301  Weight: 57 kg (125 lb 10.6 oz)    Examination:  General exam: Appears calm and comfortable not in distress.  Respiratory system: Clear to auscultation. Respiratory effort normal. No wheezing or rhonchi.  Cardiovascular system: S1 & S2 heard, RRR. No JVD, murmurs No pedal edema. Gastrointestinal system: Abdomen is soft NT ND BS+ Central nervous system: Alert and oriented. No focal neurological deficits. Extremities: Symmetric 5 x 5 power. Skin: No rashes, lesions or ulcers Psychiatry: . Mood & affect appropriate.     Data Reviewed: I have personally reviewed following labs and imaging studies  CBC: Recent Labs  Lab 08/27/17 1410 08/27/17 1946 08/28/17 0145 08/28/17 0804 08/28/17 1344 08/29/17 0439  WBC 6.3 5.6 5.5 5.7 6.3 6.0  NEUTROABS 4.5  --   --   --   --   --   HGB 12.8 10.9* 11.4* 11.1* 10.8* 11.5*  HCT 38.2 33.2* 34.5* 33.3* 32.5* 35.4*  MCV 88.8 89.0 89.8 89.3 89.5 89.4  PLT 304 269 252 272 256 222   Basic Metabolic Panel: Recent Labs  Lab 08/27/17 1410  NA 140  K 3.9  CL 105  CO2 26  GLUCOSE 96  BUN 17  CREATININE 0.92  CALCIUM 8.9   GFR: Estimated Creatinine Clearance: 27.6 mL/min (by C-G formula based on SCr of 0.92 mg/dL). Liver Function Tests: Recent Labs  Lab 08/27/17 1410  AST 23  ALT 22  ALKPHOS 66  BILITOT 0.5  PROT 6.4*  ALBUMIN 3.4*   No results for input(s): LIPASE, AMYLASE in the last 168 hours. No results for input(s): AMMONIA in the last 168 hours. Coagulation Profile: No results for input(s): INR, PROTIME in the last 168 hours. Cardiac Enzymes: No results for input(s): CKTOTAL, CKMB, CKMBINDEX, TROPONINI in the last 168 hours. BNP (last 3 results) No results for input(s): PROBNP in the last 8760 hours. HbA1C: No results for input(s): HGBA1C in the last 72 hours. CBG: No results for input(s): GLUCAP in the last 168 hours. Lipid Profile: No results for input(s): CHOL, HDL, LDLCALC, TRIG, CHOLHDL, LDLDIRECT  in the last 72 hours. Thyroid Function Tests: Recent Labs    08/27/17 1410  TSH 2.629   Anemia Panel: No results for input(s): VITAMINB12, FOLATE, FERRITIN, TIBC, IRON, RETICCTPCT in the last 72 hours. Sepsis Labs: No results for input(s): PROCALCITON, LATICACIDVEN in the last 168 hours.  No results found for this or any previous visit (from the past 240 hour(s)).       Radiology Studies: Ct Abdomen Pelvis W Contrast  Result Date: 08/27/2017 CLINICAL DATA:  84 year old female with acute RIGHT-sided abdominal pain. EXAM: CT ABDOMEN AND PELVIS WITH CONTRAST TECHNIQUE: Multidetector CT imaging of the abdomen and pelvis was performed using the standard protocol following bolus administration of intravenous contrast. CONTRAST:  98mL ISOVUE-300 IOPAMIDOL (ISOVUE-300) INJECTION 61% COMPARISON:  None. FINDINGS: Lower chest: No acute abnormality.  Pacemaker leads noted. Hepatobiliary: The liver is unremarkable. Patient is status post cholecystectomy. No biliary dilatation. Pancreas: Unremarkable Spleen: Unremarkable Adrenals/Urinary Tract: The kidneys, adrenal glands and bladder are unremarkable except for bilateral renal cysts. Stomach/Bowel: A large hiatal hernia is noted. There is no evidence of bowel obstruction, definite bowel wall thickening or inflammatory changes. Colonic diverticulosis noted without diverticulitis. Vascular/Lymphatic: Aortic atherosclerosis. No enlarged abdominal or pelvic lymph nodes. Reproductive: Status post hysterectomy. No adnexal masses. Other: No ascites, abscess or pneumoperitoneum. Musculoskeletal: No acute or suspicious bony abnormalities. Multilevel degenerative disc disease, spondylosis and facet arthropathy within the lumbar spine noted. IMPRESSION: 1. No acute abnormality 2. Large hiatal hernia. 3. Aortic Atherosclerosis (ICD10-I70.0). Electronically Signed   By: Margarette Canada M.D.   On: 08/27/2017 19:22        Scheduled Meds: . levothyroxine  50 mcg Oral QAC  breakfast  . pantoprazole  40 mg Oral Q0600   Continuous Infusions: . sodium chloride 75 mL/hr at 08/28/17 1942     LOS: 2 days    Time spent: 30 minutes.     Hosie Poisson, MD [facility] Pager [phone]   If 7PM-7AM, please contact night-coverage [email or web address] Password [email or web address] 08/29/2017, 1:29 PM

## 2017-08-30 DIAGNOSIS — I10 Essential (primary) hypertension: Secondary | ICD-10-CM

## 2017-08-30 DIAGNOSIS — E039 Hypothyroidism, unspecified: Secondary | ICD-10-CM

## 2017-08-30 DIAGNOSIS — D62 Acute posthemorrhagic anemia: Secondary | ICD-10-CM

## 2017-08-30 LAB — HEMOGLOBIN AND HEMATOCRIT, BLOOD
HEMATOCRIT: 35.2 % — AB (ref 36.0–46.0)
Hemoglobin: 11.6 g/dL — ABNORMAL LOW (ref 12.0–15.0)

## 2017-08-30 NOTE — Progress Notes (Signed)
Physical Therapy Treatment Patient Details Name: Maria Schmidt MRN: [id number] DOB: Aug 30, 1919 Today's Date: 08/30/2017    History of Present Illness 84 yo female admitted with GI bleed. Hx of CAD, pacemaker, hypothyroidism, bowel/bladder incontinence    PT Comments    Progressing with mobility.   Follow Up Recommendations  Home health PT;Supervision - Intermittent     Equipment Recommendations  None recommended by PT    Recommendations for Other Services       Precautions / Restrictions Precautions Precautions: Fall Restrictions Weight Bearing Restrictions: No    Mobility  Bed Mobility Overal bed mobility: Modified Independent             General bed mobility comments: increased time.   Transfers Overall transfer level: Needs assistance Equipment used: Rolling walker (2 wheeled) Transfers: Sit to/from Stand Sit to Stand: Min guard         General transfer comment: close guard for safety. Increased time/effort/multiple attempts for pt  to rise from a low surface.   Ambulation/Gait Ambulation/Gait assistance: Min guard Gait Distance (Feet): 500 Feet Assistive device: Rolling walker (2 wheeled) Gait Pattern/deviations: Step-through pattern;Decreased stride length     General Gait Details: close guard for safety. Pt denied dizziness.    Stairs             Wheelchair Mobility    Modified Rankin (Stroke Patients Only)       Balance Overall balance assessment: Needs assistance         Standing balance support: Bilateral upper extremity supported Standing balance-Leahy Scale: Poor Standing balance comment: requires RW for support                            Cognition Arousal/Alertness: Awake/alert Behavior During Therapy: WFL for tasks assessed/performed Overall Cognitive Status: Within Functional Limits for tasks assessed                                        Exercises      General Comments         Pertinent Vitals/Pain Pain Assessment: Faces Faces Pain Scale: Hurts a little bit Pain Location: abdomen Pain Descriptors / Indicators: Sore Pain Intervention(s): Monitored during session    Home Living                      Prior Function            PT Goals (current goals can now be found in the care plan section) Progress towards PT goals: Progressing toward goals    Frequency    Min 3X/week      PT Plan Current plan remains appropriate    Co-evaluation              AM-PAC PT "6 Clicks" Daily Activity  Outcome Measure  Difficulty turning over in bed (including adjusting bedclothes, sheets and blankets)?: None Difficulty moving from lying on back to sitting on the side of the bed? : None Difficulty sitting down on and standing up from a chair with arms (e.g., wheelchair, bedside commode, etc,.)?: A Little Help needed moving to and from a bed to chair (including a wheelchair)?: A Little Help needed walking in hospital room?: A Little Help needed climbing 3-5 steps with a railing? : A Little 6 Click Score: 20    End of Session Equipment  Utilized During Treatment: Gait belt Activity Tolerance: Patient tolerated treatment well Patient left: in chair;with call bell/phone within reach;with chair alarm set   PT Visit Diagnosis: Muscle weakness (generalized) (M62.81);Unsteadiness on feet (R26.81);Difficulty in walking, not elsewhere classified (R26.2)     Time: 1155-2080 PT Time Calculation (min) (ACUTE ONLY): 21 min  Charges:  $Gait Training: 8-22 mins                    G Codes:      Weston Anna, MPT Pager: [phone]

## 2017-08-30 NOTE — Care Management Note (Signed)
Case Management Note  Patient Details  Name: Maria Schmidt MRN: [id number] Date of Birth: 12/29/1919  Subjective/Objective:       hhx rn and pt             Action/Plan: Health team advantage patient/texted to [place] [facility] to take.  Expected Discharge Date:  (unknown)               Expected Discharge Plan:  [place]  In-House Referral:     Discharge planning Services  CM Consult  Post Acute Care Choice:    Choice offered to:  NA  DME Arranged:    DME Agency:     HH Arranged:  RN, PT [place] Agency:  [facility]  Status of Service:  Completed, signed off  If discussed at [place] of Stay Meetings, dates discussed:    Additional Comments:  Leeroy Cha, RN 08/30/2017, 9:07 AM

## 2017-08-30 NOTE — Progress Notes (Addendum)
Patient ID: Maria Schmidt, female   DOB: [birth year], 85 y.o.   MRN: [id number]    Progress Note   Subjective   No Bm's or bleeding  Had c/o feeling woozy - was lightheaded last pm- BP med has been stopped HGB 11.6 better Orthostatics  pending   Objective   Vital signs in last 24 hours: Temp:  [97.8 F (36.6 C)-98.1 F (36.7 C)] 98.1 F (36.7 C) (07/05 0432) Pulse Rate:  [69-84] 70 (07/05 0432) Resp:  [14-18] 16 (07/05 0432) BP: (92-144)/(40-73) 141/73 (07/05 0432) SpO2:  [96 %-100 %] 97 % (07/05 0432) Last BM Date: 08/28/17 General:   Elderly  white female in NAD Heart:  Regular rate and rhythm; no murmurs Lungs: Respirations even and unlabored, lungs CTA bilaterally Abdomen:  Soft, nontender and nondistended. Normal bowel sounds. Extremities:  Without edema. Neurologic:  Alert and oriented,  grossly normal neurologically. Psych:  Cooperative. Normal mood and affect.   Lab Results: Recent Labs    08/28/17 0804 08/28/17 1344 08/29/17 0439 08/29/17 1447 08/30/17 0453  WBC 5.7 6.3 6.0  --   --   HGB 11.1* 10.8* 11.5* 11.3* 11.6*  HCT 33.3* 32.5* 35.4* 34.4* 35.2*  PLT 272 256 297  --   --    BMET Recent Labs    08/27/17 1410  NA 140  K 3.9  CL 105  CO2 26  GLUCOSE 96  BUN 17  CREATININE 0.92  CALCIUM 8.9   LFT Recent Labs    08/27/17 1410  PROT 6.4*  ALBUMIN 3.4*  AST 23  ALT 22  ALKPHOS 66  BILITOT 0.5   PT/INR No results for input(s): LABPROT, INR in the last 72 hours.  Studies/Results: No results found.     Assessment / Plan:    #1 84 yo WF with acute lower GI bleed secondary to diverticular hemorrhage- resolved - HGB stable  #2 anemia - secondary to blood loss     Plan ; Ok from GI perspective for discharge home today  Should have follow up with her PCP in [place] next week for repeat CBC Follow up with GI as needed     Contact  Amy [place], P.A.-C               [phone]  [phone]   \________________________________________________________________________  Velora Heckler GI MD note:  I personally examined the patient, reviewed the data and agree with the assessment and plan described above. The bleeding has stopped, never needed blood transfusion. No further testing is planned for the presumed diverticular bleed.  She is OK for discharge today. GI follow up PRN   Owens Loffler, MD [facility] Gastroenterology Pager [phone]

## 2017-09-02 NOTE — Discharge Summary (Signed)
Physician Discharge Summary  DEONA NOVITSKI YIF:[id number] DOB: 1919/07/01 DOA: 08/27/2017  PCP: Ronita Hipps, MD  Admit date: 08/27/2017 Discharge date: 08/30/2017  Admitted From: Home.  Disposition:  Home.   Recommendations for Outpatient Follow-up:  1. Follow up with PCP in 1-2 weeks 2. Please obtain BMP/CBC in one week 3. Please follow up with gastroenterology as needed.  4. We have held aspirin for one week in view of the recent bleeding.   Home Health:yes   Discharge Condition:stable.  CODE STATUS: DNR Diet recommendation: Heart Healthy   Brief/Interim Summary: Maralyn Witherell Newmanis a 84 y.o.femalewith medical history significant ofCADs/p PPM, hypothyroidism, diverticulosis, and esophageal stricture presented to [facility] on 7/2 due to blood in stools She denies nausea, vomiting and abdominal pain. [facility] ED, hemoglobin 13.9, bloody bowel movement witnessed by EDP. No GI coverage at [place] for GI evaluation. Labs unremarkable.  Her hemoglobin dropped to 11 but stabilized. GI consulted and awaiting recommendations .  Currently no bloody bowel movements.  Started her on clears advanced to regular.      Discharge Diagnoses:  Active Problems:   GI bleed   Essential hypertension   Hypothyroidism   Anemia  ? Lower GI bleed : possibly diverticular.  Appears to have resolved. No nausea or vomiting. Or abdominal pain.  Hemoglobin stabilized at 11.  Resume PPI.  GI consulted, appreciate the recommendations.  Holding aspirin for one week, after which it can be resumed as per PCP or GI.    Hypertension: Pt was found to have positive orthostatics, imdur was held on discharge.    Hypothyroidism: Resume synthroid.      Discharge Instructions  Discharge Instructions    Diet - low sodium heart healthy   Complete by:  As directed    Discharge instructions   Complete by:  As directed    We have stopped your aspirin  81 mg for your recent bleed. You can restart the aspirin in one week after you speak with your PCP.  We also have stopped your IMDUR because of your borderline BP measurements. Please restart your medications in one week after talking to your PCP.   Please check hemoglobin in one week.     Allergies as of 08/30/2017      Reactions   Lipitor [atorvastatin] Itching      Medication List    STOP taking these medications   aspirin EC 81 MG tablet   isosorbide mononitrate 30 MG 24 hr tablet Commonly known as:  IMDUR     TAKE these medications   ENSURE Take 237 mLs by mouth daily.   iron polysaccharides 150 MG capsule Commonly known as:  NIFEREX Take 150 mg by mouth every other day.   levothyroxine 50 MCG tablet Commonly known as:  SYNTHROID, LEVOTHROID Take 50 mcg by mouth daily.   metoprolol tartrate 25 MG tablet Commonly known as:  LOPRESSOR Take 12.5 mg by mouth 2 (two) times daily.   MULTI-VITAMINS Tabs Take by mouth.   Omega-3 1000 MG Caps Take by mouth.   pantoprazole 40 MG tablet Commonly known as:  PROTONIX Take 40 mg by mouth daily.   ranolazine 500 MG 12 hr tablet Commonly known as:  RANEXA Take 1 tablet (500 mg total) 2 (two) times daily by mouth.       Allergies  Allergen Reactions  . Lipitor [Atorvastatin] Itching    Consultations:  Gastroenterology.    Procedures/Studies: Ct Abdomen Pelvis W Contrast  Result Date:  08/27/2017 CLINICAL DATA:  84 year old female with acute RIGHT-sided abdominal pain. EXAM: CT ABDOMEN AND PELVIS WITH CONTRAST TECHNIQUE: Multidetector CT imaging of the abdomen and pelvis was performed using the standard protocol following bolus administration of intravenous contrast. CONTRAST:  87mL ISOVUE-300 IOPAMIDOL (ISOVUE-300) INJECTION 61% COMPARISON:  None. FINDINGS: Lower chest: No acute abnormality.  Pacemaker leads noted. Hepatobiliary: The liver is unremarkable. Patient is status post cholecystectomy. No biliary  dilatation. Pancreas: Unremarkable Spleen: Unremarkable Adrenals/Urinary Tract: The kidneys, adrenal glands and bladder are unremarkable except for bilateral renal cysts. Stomach/Bowel: A large hiatal hernia is noted. There is no evidence of bowel obstruction, definite bowel wall thickening or inflammatory changes. Colonic diverticulosis noted without diverticulitis. Vascular/Lymphatic: Aortic atherosclerosis. No enlarged abdominal or pelvic lymph nodes. Reproductive: Status post hysterectomy. No adnexal masses. Other: No ascites, abscess or pneumoperitoneum. Musculoskeletal: No acute or suspicious bony abnormalities. Multilevel degenerative disc disease, spondylosis and facet arthropathy within the lumbar spine noted. IMPRESSION: 1. No acute abnormality 2. Large hiatal hernia. 3. Aortic Atherosclerosis (ICD10-I70.0). Electronically Signed   By: Margarette Canada M.D.   On: 08/27/2017 19:22       Subjective: Denies any new complaints.   Discharge Exam: Vitals:   08/29/17 2047 08/30/17 0432  BP: (!) 131/48 (!) 141/73  Pulse: 70 70  Resp: 18 16  Temp: 98 F (36.7 C) 98.1 F (36.7 C)  SpO2: 99% 97%   Vitals:   08/29/17 1751 08/29/17 1755 08/29/17 2047 08/30/17 0432  BP: (!) 142/54 (!) 92/41 (!) 131/48 (!) 141/73  Pulse: 82 77 70 70  Resp:   18 16  Temp:   98 F (36.7 C) 98.1 F (36.7 C)  TempSrc:   Oral Oral  SpO2: 99% 98% 99% 97%  Weight:      Height:        General: Pt is alert, awake, not in acute distress Cardiovascular: RRR, S1/S2 +, no rubs, no gallops Respiratory: CTA bilaterally, no wheezing, no rhonchi Abdominal: Soft, NT, ND, bowel sounds + Extremities: no edema, no cyanosis    The results of significant diagnostics from this hospitalization (including imaging, microbiology, ancillary and laboratory) are listed below for reference.     Microbiology: No results found for this or any previous visit (from the past 240 hour(s)).   Labs: BNP (last 3 results) No results  for input(s): BNP in the last 8760 hours. Basic Metabolic Panel: Recent Labs  Lab 08/27/17 1410  NA 140  K 3.9  CL 105  CO2 26  GLUCOSE 96  BUN 17  CREATININE 0.92  CALCIUM 8.9   Liver Function Tests: Recent Labs  Lab 08/27/17 1410  AST 23  ALT 22  ALKPHOS 66  BILITOT 0.5  PROT 6.4*  ALBUMIN 3.4*   No results for input(s): LIPASE, AMYLASE in the last 168 hours. No results for input(s): AMMONIA in the last 168 hours. CBC: Recent Labs  Lab 08/27/17 1410 08/27/17 1946 08/28/17 0145 08/28/17 0804 08/28/17 1344 08/29/17 0439 08/29/17 1447 08/30/17 0453  WBC 6.3 5.6 5.5 5.7 6.3 6.0  --   --   NEUTROABS 4.5  --   --   --   --   --   --   --   HGB 12.8 10.9* 11.4* 11.1* 10.8* 11.5* 11.3* 11.6*  HCT 38.2 33.2* 34.5* 33.3* 32.5* 35.4* 34.4* 35.2*  MCV 88.8 89.0 89.8 89.3 89.5 89.4  --   --   PLT 304 269 252 272 256 297  --   --  Cardiac Enzymes: No results for input(s): CKTOTAL, CKMB, CKMBINDEX, TROPONINI in the last 168 hours. BNP: Invalid input(s): POCBNP CBG: No results for input(s): GLUCAP in the last 168 hours. D-Dimer No results for input(s): DDIMER in the last 72 hours. Hgb A1c No results for input(s): HGBA1C in the last 72 hours. Lipid Profile No results for input(s): CHOL, HDL, LDLCALC, TRIG, CHOLHDL, LDLDIRECT in the last 72 hours. Thyroid function studies No results for input(s): TSH, T4TOTAL, T3FREE, THYROIDAB in the last 72 hours.  Invalid input(s): FREET3 Anemia work up No results for input(s): VITAMINB12, FOLATE, FERRITIN, TIBC, IRON, RETICCTPCT in the last 72 hours. Urinalysis No results found for: COLORURINE, APPEARANCEUR, LABSPEC, [place], GLUCOSEU, HGBUR, BILIRUBINUR, KETONESUR, PROTEINUR, UROBILINOGEN, NITRITE, LEUKOCYTESUR Sepsis Labs Invalid input(s): PROCALCITONIN,  WBC,  LACTICIDVEN Microbiology No results found for this or any previous visit (from the past 240 hour(s)).   Time coordinating discharge: 32   minutes  SIGNED:   Hosie Poisson, MD  [facility] 09/02/2017, 8:10 AM Pager   If 7PM-7AM, please contact night-coverage [email or web address] Password [email or web address]

## 2017-09-10 DIAGNOSIS — Z8719 Personal history of other diseases of the digestive system: Secondary | ICD-10-CM | POA: Diagnosis not present

## 2017-09-10 DIAGNOSIS — Z1331 Encounter for screening for depression: Secondary | ICD-10-CM | POA: Diagnosis not present

## 2017-09-10 DIAGNOSIS — Z6821 Body mass index (BMI) 21.0-21.9, adult: Secondary | ICD-10-CM | POA: Diagnosis not present

## 2017-11-12 DIAGNOSIS — M1712 Unilateral primary osteoarthritis, left knee: Secondary | ICD-10-CM | POA: Diagnosis not present

## 2018-01-11 ENCOUNTER — Other Ambulatory Visit: Payer: Self-pay | Admitting: Cardiology

## 2018-01-15 ENCOUNTER — Telehealth: Payer: Self-pay | Admitting: Cardiology

## 2018-01-15 NOTE — Telephone Encounter (Signed)
Error

## 2018-01-27 DIAGNOSIS — I2510 Atherosclerotic heart disease of native coronary artery without angina pectoris: Secondary | ICD-10-CM | POA: Diagnosis not present

## 2018-01-27 DIAGNOSIS — Z23 Encounter for immunization: Secondary | ICD-10-CM | POA: Diagnosis not present

## 2018-01-27 DIAGNOSIS — Z79899 Other long term (current) drug therapy: Secondary | ICD-10-CM | POA: Diagnosis not present

## 2018-01-27 DIAGNOSIS — E039 Hypothyroidism, unspecified: Secondary | ICD-10-CM | POA: Diagnosis not present

## 2018-01-27 DIAGNOSIS — Z6822 Body mass index (BMI) 22.0-22.9, adult: Secondary | ICD-10-CM | POA: Diagnosis not present

## 2018-01-27 DIAGNOSIS — Z2821 Immunization not carried out because of patient refusal: Secondary | ICD-10-CM | POA: Diagnosis not present

## 2018-02-13 DIAGNOSIS — M1712 Unilateral primary osteoarthritis, left knee: Secondary | ICD-10-CM | POA: Diagnosis not present

## 2018-03-03 NOTE — Progress Notes (Signed)
Cardiology Office Note:    Date:  03/04/2018   ID:  Maria Schmidt, DOB 07/23/1919, MRN [id number]  PCP:  Ronita Hipps, MD  Cardiologist:  Shirlee More, MD    Referring MD: Ronita Hipps, MD    ASSESSMENT:    1. Coronary artery disease of native artery of native heart with stable angina pectoris ([place])   2. Essential hypertension   3. Dyslipidemia   4. Pacemaker   5. SSS (sick sinus syndrome) (H[facility])   6. Statin intolerance    PLAN:    In order of problems listed above:  1. CAD stable New [place] Heart Association class I she will continue current medical treatment note she is taking aspirin initiate lipid-lowering therapy with PCSK9 and I would be extremely reluctant to consider further invasive cardiology evaluation or revascularization. 2. Stable continue current treatment is no longer taking beta-blocker 3. Poorly controlled her last LDL was 161 we will initiate PCSK9 treatment 4. Pacemaker dependent needs follow-up with schedule with Dr. Curt Bears in device clinic as soon as possible 5. Asymptomatic with device therapy   Next appointment:3 months   Medication Adjustments/Labs and Tests Ordered: Current medicines are reviewed at length with the patient today.  Concerns regarding medicines are outlined above.  No orders of the defined types were placed in this encounter.  No orders of the defined types were placed in this encounter.   Chief Complaint  Patient presents with  . Follow-up    pacemaker  . Coronary Artery Disease    History of Present Illness:    Maria Schmidt is a 85 y.o. female with a hx of CAD bare-metal stent to left anterior descending 2011 EF 70% dyslipidemia hypertension and sick sinus syndrome with a [employer] pacemaker by me at [facility] 06/20/15 last seen. She is not on lipid lower therapy. Compliance with diet, lifestyle and medications: Yes  She has had no pacemaker device follow-up since I left [facility] July 2018.  She was admitted to the  hospital May 2019 chest pain no evidence of myocardial infarction did not have an ischemia evaluation subsequent in July admitted to the hospital with a GI bleed.  She is taking aspirin 81 mg daily she is on no lipid-lowering therapy she tells me she cannot tolerate any statin any dose or other medicines reviewed the option of PCSK9 she is interested in a referral to lipid clinic.  She has had no angina dyspnea palpitation or syncope she is frail but still lives independently uses a walker for safety.  She will be set up in our device clinic and EP consultation she will initiate lipid-lowering therapy and will continue to follow-up with me for her coronary disease.  Note she has taken ranolazine in a very low dose 500 mg every other day as she says it works well clinically and higher doses caused her to be confused Past Medical History:  Diagnosis Date  . Coronary artery disease   . History of second degree heart block   . Hyperlipidemia   . Hypertension   . Hypothyroidism     Past Surgical History:  Procedure Laterality Date  . ABDOMINAL HYSTERECTOMY    . BREAST LUMPECTOMY    . CARDIAC CATHETERIZATION    . CHOLECYSTECTOMY    . INSERT / REPLACE / REMOVE PACEMAKER    . Stent to LAD  2011    Current Medications: Current Meds  Medication Sig  . aspirin EC 81 MG tablet Take 81 mg by  mouth daily.  Marland Kitchen ENSURE (ENSURE) Take 237 mLs by mouth daily.  . iron polysaccharides (NIFEREX) 150 MG capsule Take 150 mg by mouth every other day.   . levothyroxine (SYNTHROID, LEVOTHROID) 50 MCG tablet Take 50 mcg by mouth daily.  . pantoprazole (PROTONIX) 40 MG tablet Take 40 mg by mouth daily.  . ranolazine (RANEXA) 500 MG 12 hr tablet Take 1 tablet (500 mg total) 2 (two) times daily by mouth.     Allergies:   Lipitor [atorvastatin]   Social History   Socioeconomic History  . Marital status: Widowed    Spouse name: Not on file  . Number of children: Not on file  . Years of education: Not on file  .  Highest education level: Not on file  Occupational History  . Not on file  Social Needs  . Financial resource strain: Not on file  . Food insecurity:    Worry: Not on file    Inability: Not on file  . Transportation needs:    Medical: Not on file    Non-medical: Not on file  Tobacco Use  . Smoking status: Never Smoker  . Smokeless tobacco: Never Used  Substance and Sexual Activity  . Alcohol use: Never    Frequency: Never  . Drug use: Never  . Sexual activity: Not Currently  Lifestyle  . Physical activity:    Days per week: Not on file    Minutes per session: Not on file  . Stress: Not on file  Relationships  . Social connections:    Talks on phone: Not on file    Gets together: Not on file    Attends religious service: Not on file    Active member of club or organization: Not on file    Attends meetings of clubs or organizations: Not on file    Relationship status: Not on file  Other Topics Concern  . Not on file  Social History Narrative  . Not on file     Family History: The patient's family history includes Cancer in her father; Heart attack in her father. ROS:   Please see the history of present illness.    All other systems reviewed and are negative.  EKGs/Labs/Other Studies Reviewed:    The following studies were reviewed today:  EKG:  EKG ordered today.  The ekg ordered today demonstrates shows underlying sinus rhythm normal dual-chamber pacemaker 100% ventricularly paced  Recent Labs: 08/27/2017: ALT 22; BUN 17; Creatinine, Ser 0.92; Potassium 3.9; Sodium 140; TSH 2.629 08/29/2017: Platelets 297 08/30/2017: Hemoglobin 11.6  Recent Lipid Panel No results found for: CHOL, TRIG, HDL, CHOLHDL, VLDL, LDLCALC, LDLDIRECT  Physical Exam:    VS:  BP 100/60 (BP Location: Left Arm, Patient Position: Sitting, Cuff Size: Normal)   Pulse 94   Ht 5\' 1"  (1.549 m)   Wt 126 lb 6 oz (57.3 kg)   SpO2 98%   BMI 23.88 kg/m     Wt Readings from Last 3 Encounters:    03/04/18 126 lb 6 oz (57.3 kg)  08/27/17 125 lb 10.6 oz (57 kg)     GEN: Alert but looks frail well nourished, well developed in no acute distress HEENT: Normal NECK: No JVD; No carotid bruits LYMPHATICS: No lymphadenopathy CARDIAC: RRR, no murmurs, rubs, gallops RESPIRATORY:  Clear to auscultation without rales, wheezing or rhonchi  ABDOMEN: Soft, non-tender, non-distended MUSCULOSKELETAL:  No edema; No deformity  SKIN: Warm and dry NEUROLOGIC:  Alert and oriented x 3 PSYCHIATRIC:  Normal  affect    Signed, Shirlee More, MD  03/04/2018 10:20 AM    [place]

## 2018-03-04 ENCOUNTER — Telehealth: Payer: Self-pay | Admitting: *Deleted

## 2018-03-04 ENCOUNTER — Encounter: Payer: Self-pay | Admitting: Cardiology

## 2018-03-04 ENCOUNTER — Ambulatory Visit: Payer: PPO | Admitting: Cardiology

## 2018-03-04 VITALS — BP 100/60 | HR 94 | Ht 61.0 in | Wt 126.4 lb

## 2018-03-04 DIAGNOSIS — I495 Sick sinus syndrome: Secondary | ICD-10-CM

## 2018-03-04 DIAGNOSIS — E785 Hyperlipidemia, unspecified: Secondary | ICD-10-CM

## 2018-03-04 DIAGNOSIS — I25118 Atherosclerotic heart disease of native coronary artery with other forms of angina pectoris: Secondary | ICD-10-CM | POA: Diagnosis not present

## 2018-03-04 DIAGNOSIS — Z789 Other specified health status: Secondary | ICD-10-CM

## 2018-03-04 DIAGNOSIS — I10 Essential (primary) hypertension: Secondary | ICD-10-CM | POA: Diagnosis not present

## 2018-03-04 DIAGNOSIS — Z950 Presence of cardiac pacemaker: Secondary | ICD-10-CM

## 2018-03-04 HISTORY — DX: Other specified health status: Z78.9

## 2018-03-04 MED ORDER — EVOLOCUMAB 140 MG/ML ~~LOC~~ SOAJ: 1.0000 | SUBCUTANEOUS | Status: DC

## 2018-03-04 MED ORDER — RANOLAZINE ER 500 MG PO TB12: 500.0000 mg | ORAL_TABLET | Freq: Two times a day (BID) | ORAL | 2 refills | Status: DC

## 2018-03-04 NOTE — Patient Instructions (Signed)
Medication Instructions:  Your physician has recommended you make the following change in your medication:  START repatha 140 mg: Inject 140 mg (1 syringe) into your subcutaneous tissue every 14 days-This medication will be started after you are seen at the [facility]  If you need a refill on your cardiac medications before your next appointment, please call your pharmacy.   Lab work: None  If you have labs (blood work) drawn today and your tests are completely normal, you will receive your results only by: Marland Kitchen MyChart Message (if you have MyChart) OR . A paper copy in the mail If you have any lab test that is abnormal or we need to change your treatment, we will call you to review the results.  Testing/Procedures: You had an EKG today.   You have been referred to the [facility] to be set up with repatha. You will be contacted to schedule this appointment.   You have been referred to see an electrophysiologist, Dr. Curt Bears. You will be contacted to schedule this appointment.    Follow-Up: At [facility], you and your health needs are our priority.  As part of our continuing mission to provide you with exceptional heart care, we have created designated Provider Care Teams.  These Care Teams include your primary Cardiologist (physician) and Advanced Practice Providers (APPs -  Physician Assistants and Nurse Practitioners) who all work together to provide you with the care you need, when you need it. You will need a follow up appointment in 3 months.  Please call our office 2 months in advance to schedule this appointment.       Subcutaneous Injection Instructions Using a Prefilled Syringe A subcutaneous injection is a shot of medicine that is given into the layer of fat and tissue between skin and muscle. The injection is given with a single-use syringe that already has medicine inside of it (prefilled syringe). Read the medication guide or package insert that came with the  syringe. Follow directions from the guide about how to prepare and give the injection. This is important because the directions may be different for each medicine. Use only the syringe, needle, and medicine that your health care provider prescribes. Use each prefilled syringe and needle only one time. Supplies needed:  Prefilled syringe with needle. Use the needle length and size (gauge) that your health care provider or pharmacist gives to you.  Alcohol wipes.  Bandage.  A container for syringe disposal. This may be a puncture-proof sharps container or a hard-sided plastic container that has a secure lid, such as an empty laundry detergent bottle. How to choose a site for injection Follow instructions from your health care provider about where to give an injection. Do not inject in the same spot each time. There are five main areas that can be used for injecting. These areas include:  Abdomen. Avoid the area that is within 2 inches (5 cm) of your navel (umbilicus).  Front of thigh.  Upper, outer side of thigh.  Upper, outer side of arm.  Upper, outer part of buttock. How to give an injection using a prefilled syringe  1. Wash your hands with soap and water. If soap and water are not available, use hand sanitizer. 2. Use an alcohol wipe to clean the site where you will be injecting the needle. Let the site air-dry. 3. Remove the plastic cover from the needle on the syringe. Do not let the needle touch anything. 4. Hold the syringe with the needle  pointing up. Check the syringe for any remaining air bubbles. If there are air bubbles, flick the syringe with your finger until the air bubbles rise to the top. Then, gently push on the plunger until you can see a drop of medicine appear at the tip of the needle. This will clear any remaining air bubbles from the syringe. 5. Hold the syringe in your writing hand like a pencil. 6. Use your other hand to pinch and hold about an inch (2.5 cm) of  skin. Do not directly touch the cleaned part of the skin. 7. Insert the entire needle straight into the fold of skin. The needle should be at a 90-degree angle (perpendicular) to the skin. Push the needle all the way against the skin. The needle may need to be injected at a 45-degree angle in thin adults or children who have a small amount of body fat. 8. After the needle is completely inserted into the skin, release the skin that you are pinching. Continue to hold the syringe with your writing hand. 9. Use your thumb or index finger of your writing hand to push the plunger all the way into the syringe to inject the medicine. 10. Pull the needle straight out of the skin. 11. Press and hold the alcohol wipe over the injection site until bleeding stops. Do not rub the area. 12. Cover the injection site with a bandage, if needed. How to safely throw away the supplies If you are using a syringe that does not have a safety system for shielding the needle after injection:  Do not recap the needle. Place the syringe and needle in the disposal container. If your syringe has a safety system for shielding the needle after injection:  Firmly push down on the plunger after you complete the injection. The protective sleeve will automatically cover the needle, and you will hear a click. The click means that the needle is safely covered. Follow the disposal regulations for the area where you live. Do not use any syringe or needle more than one time. Contact a health care provider if:  You have difficulty giving the injection.  You think that the injection was not given correctly.  You have difficulty with any of the supplies.  The medicine causes side effects.  Rashes develop on the skin.  A fever develops.  The condition that is being treated gets worse. Get help right away if:  Any of these symptoms develop after the injection is given: ? Difficulty breathing. ? Chest pain. ? A rash over most  or all of the body. ? Swelling of the lips or tongue. ? Difficulty swallowing. Summary  A subcutaneous injection is a shot of medicine that is given into the layer of fat and tissue between skin and muscle.  Read the medication guide or package insert that came with the syringe. Follow directions from the guide about how to prepare and give the injection.  Follow instructions from your health care provider about where to give an injection. This information is not intended to replace advice given to you by your health care provider. Make sure you discuss any questions you have with your health care provider. Document Released: 10/26/2010 Document Revised: 03/19/2017 Document Reviewed: 08/27/2014 Elsevier Interactive Patient Education  2019 [place] injection What is this medicine? EVOLOCUMAB (e voe LOK ue mab) is known as a PCSK9 inhibitor. It is used to lower the level of cholesterol in the blood. It may be used  alone or in combination with other cholesterol-lowering drugs. This drug may also be used to reduce the risk of heart attack, stroke, and certain types of heart surgery in patients with heart disease. This medicine may be used for other purposes; ask your health care provider or pharmacist if you have questions. COMMON BRAND NAME(S): Repatha What should I tell my health care provider before I take this medicine? They need to know if you have any of these conditions: -an unusual or allergic reaction to evolocumab, other medicines, foods, dyes, or preservatives -pregnant or trying to get pregnant -breast-feeding How should I use this medicine? This medicine is for injection under the skin. You will be taught how to prepare and give this medicine. Use exactly as directed. Take your medicine at regular intervals. Do not take your medicine more often than directed. It is important that you put your used needles and syringes in a special sharps container. Do not put  them in a trash can. If you do not have a sharps container, call your pharmacist or health care provider to get one. Talk to your pediatrician regarding the use of this medicine in children. While this drug may be prescribed for children as young as 13 years for selected conditions, precautions do apply. Overdosage: If you think you have taken too much of this medicine contact a poison control center or emergency room at once. NOTE: This medicine is only for you. Do not share this medicine with others. What if I miss a dose? If you miss a dose, take it as soon as you can if there are more than 7 days until the next scheduled dose, or skip the missed dose and take the next dose according to your original schedule. Do not take double or extra doses. What may interact with this medicine? Interactions are not expected. This list may not describe all possible interactions. Give your health care provider a list of all the medicines, herbs, non-prescription drugs, or dietary supplements you use. Also tell them if you smoke, drink alcohol, or use illegal drugs. Some items may interact with your medicine. What should I watch for while using this medicine? You may need blood work while you are taking this medicine. What side effects may I notice from receiving this medicine? Side effects that you should report to your doctor or health care professional as soon as possible: -allergic reactions like skin rash, itching or hives, swelling of the face, lips, or tongue -signs and symptoms of high blood sugar such as dizziness; dry mouth; dry skin; fruity breath; nausea; stomach pain; increased hunger or thirst; increased urination -signs and symptoms of infection like fever or chills; cough; sore throat; pain or trouble passing urine Side effects that usually do not require medical attention (report to your doctor or health care professional if they continue or are bothersome): -diarrhea -nausea -muscle  pain -pain, redness, or irritation at site where injected This list may not describe all possible side effects. Call your doctor for medical advice about side effects. You may report side effects to FDA at 1-800-FDA-1088. Where should I keep my medicine? Keep out of the reach of children. You will be instructed on how to store this medicine. Throw away any unused medicine after the expiration date on the label. NOTE: This sheet is a summary. It may not cover all possible information. If you have questions about this medicine, talk to your doctor, pharmacist, or health care provider.  2019 Elsevier/Gold Standard (2016-09-24 13:31:00)

## 2018-03-04 NOTE — Telephone Encounter (Signed)
Patient has been referred to see Dr. Curt Bears on 05/12/2018 at 10:00 am in the [place] office.   Contacted Marcia with [employer] to set up pacemaker interrogation in the meantime. Ann Held reports that she is with a patient and will call me back.

## 2018-03-04 NOTE — Telephone Encounter (Signed)
Spoke with Tomi Bamberger and patient. Patient will return to the [place] office on Friday, 03/07/2018 at 11 am for device interrogation by [employer] representative.   Patient is going to think about whether or not she wants to start repatha and let me know her decision.

## 2018-03-07 NOTE — Addendum Note (Signed)
Addended by: Austin Miles on: 03/07/2018 11:43 AM   Modules accepted: Orders

## 2018-04-01 DIAGNOSIS — H353131 Nonexudative age-related macular degeneration, bilateral, early dry stage: Secondary | ICD-10-CM | POA: Diagnosis not present

## 2018-04-22 LAB — CUP PACEART INCLINIC DEVICE CHECK
Date Time Interrogation Session: 20200225133129
Implantable Lead Implant Date: 20120906
Implantable Lead Implant Date: 20120906
Implantable Lead Location: 753859
Implantable Lead Model: 4076
Implantable Pulse Generator Implant Date: 20120906
MDC IDC LEAD LOCATION: 753860

## 2018-05-12 ENCOUNTER — Encounter: Payer: PPO | Admitting: Cardiology

## 2018-05-22 ENCOUNTER — Telehealth: Payer: Self-pay | Admitting: Cardiology

## 2018-05-22 MED ORDER — NITROGLYCERIN 0.4 MG SL SUBL: 0.4000 mg | SUBLINGUAL_TABLET | SUBLINGUAL | 5 refills | Status: DC | PRN

## 2018-05-22 NOTE — Telephone Encounter (Signed)
°*  STAT* If patient is at the pharmacy, call can be transferred to refill team.   1. Which medications need to be refilled? (please list name of each medication and dose if known) Nitrostat  2. Which pharmacy/location (including street and city if local pharmacy) is medication to be sent to?[facility]  3. Do they need a 30 day or 90 day supply? 63   Dr. Bettina Gavia told her that he would call her in some for her, she is taking them quite frequently, she has spells at night.

## 2018-05-22 NOTE — Telephone Encounter (Signed)
Prescription for nitroglycerin as needed sent to [facility] in [place] as requested per Dr. Bettina Gavia.

## 2018-05-22 NOTE — Addendum Note (Signed)
Addended by: Austin Miles on: 05/22/2018 04:06 PM   Modules accepted: Orders

## 2018-05-29 DIAGNOSIS — I10 Essential (primary) hypertension: Secondary | ICD-10-CM | POA: Diagnosis not present

## 2018-05-29 DIAGNOSIS — R5383 Other fatigue: Secondary | ICD-10-CM | POA: Diagnosis not present

## 2018-05-29 DIAGNOSIS — M2559 Pain in other specified joint: Secondary | ICD-10-CM | POA: Diagnosis not present

## 2018-05-29 DIAGNOSIS — R5381 Other malaise: Secondary | ICD-10-CM | POA: Diagnosis not present

## 2018-05-29 DIAGNOSIS — M1712 Unilateral primary osteoarthritis, left knee: Secondary | ICD-10-CM | POA: Diagnosis not present

## 2018-05-29 DIAGNOSIS — K219 Gastro-esophageal reflux disease without esophagitis: Secondary | ICD-10-CM | POA: Diagnosis not present

## 2018-05-29 DIAGNOSIS — Z0000 Encounter for general adult medical examination without abnormal findings: Secondary | ICD-10-CM | POA: Diagnosis not present

## 2018-05-29 DIAGNOSIS — E782 Mixed hyperlipidemia: Secondary | ICD-10-CM | POA: Diagnosis not present

## 2018-05-29 DIAGNOSIS — M1A9XX0 Chronic gout, unspecified, without tophus (tophi): Secondary | ICD-10-CM | POA: Diagnosis not present

## 2018-06-25 ENCOUNTER — Telehealth: Payer: Self-pay | Admitting: *Deleted

## 2018-06-27 NOTE — Telephone Encounter (Signed)
Device Model   [employer] [id number] [place]  [id number]   Transmission from 03-04-2018 in media tab.

## 2018-08-26 DIAGNOSIS — I2510 Atherosclerotic heart disease of native coronary artery without angina pectoris: Secondary | ICD-10-CM | POA: Diagnosis not present

## 2018-08-26 DIAGNOSIS — E039 Hypothyroidism, unspecified: Secondary | ICD-10-CM | POA: Diagnosis not present

## 2018-09-02 DIAGNOSIS — D2261 Melanocytic nevi of right upper limb, including shoulder: Secondary | ICD-10-CM | POA: Diagnosis not present

## 2018-09-02 DIAGNOSIS — C44329 Squamous cell carcinoma of skin of other parts of face: Secondary | ICD-10-CM | POA: Diagnosis not present

## 2018-09-02 DIAGNOSIS — L570 Actinic keratosis: Secondary | ICD-10-CM | POA: Diagnosis not present

## 2018-09-02 DIAGNOSIS — D2262 Melanocytic nevi of left upper limb, including shoulder: Secondary | ICD-10-CM | POA: Diagnosis not present

## 2018-09-11 DIAGNOSIS — C44329 Squamous cell carcinoma of skin of other parts of face: Secondary | ICD-10-CM | POA: Diagnosis not present

## 2018-09-22 DIAGNOSIS — E039 Hypothyroidism, unspecified: Secondary | ICD-10-CM | POA: Diagnosis not present

## 2018-09-22 DIAGNOSIS — Z79899 Other long term (current) drug therapy: Secondary | ICD-10-CM | POA: Diagnosis not present

## 2018-09-22 DIAGNOSIS — Z0000 Encounter for general adult medical examination without abnormal findings: Secondary | ICD-10-CM | POA: Diagnosis not present

## 2018-09-22 DIAGNOSIS — N760 Acute vaginitis: Secondary | ICD-10-CM | POA: Diagnosis not present

## 2018-09-22 DIAGNOSIS — Z1331 Encounter for screening for depression: Secondary | ICD-10-CM | POA: Diagnosis not present

## 2018-09-22 DIAGNOSIS — Z6822 Body mass index (BMI) 22.0-22.9, adult: Secondary | ICD-10-CM | POA: Diagnosis not present

## 2018-09-24 DIAGNOSIS — J329 Chronic sinusitis, unspecified: Secondary | ICD-10-CM | POA: Diagnosis not present

## 2018-10-17 DIAGNOSIS — Z6822 Body mass index (BMI) 22.0-22.9, adult: Secondary | ICD-10-CM | POA: Diagnosis not present

## 2018-10-17 DIAGNOSIS — K219 Gastro-esophageal reflux disease without esophagitis: Secondary | ICD-10-CM | POA: Diagnosis not present

## 2018-10-17 DIAGNOSIS — R112 Nausea with vomiting, unspecified: Secondary | ICD-10-CM | POA: Diagnosis not present

## 2018-10-20 IMAGING — CT CT ABD-PELV W/ CM
2 of 5 series · 16 of 46 positions shown, 18 images · IV contrast (APPLIED)
Comparison: None.

CLINICAL DATA: [AGE] female with acute RIGHT-sided abdominal
pain.

EXAM:
CT ABDOMEN AND PELVIS WITH CONTRAST
TECHNIQUE: Multidetector CT imaging of the abdomen and pelvis was performed
using the standard protocol following bolus administration of
intravenous contrast.
CONTRAST:  80mL JLIJWQ-S33 IOPAMIDOL (JLIJWQ-S33) INJECTION 61%

[Series 2: axial st · axial · 0.73mm/px · z∈[-38,+338]mm · 13 of 87 slices shown, 15 images]
[im 6/87  soft-tissue]
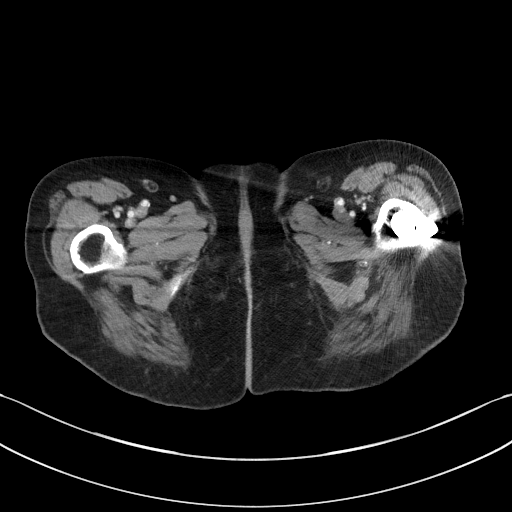
[im 6/87  bone]
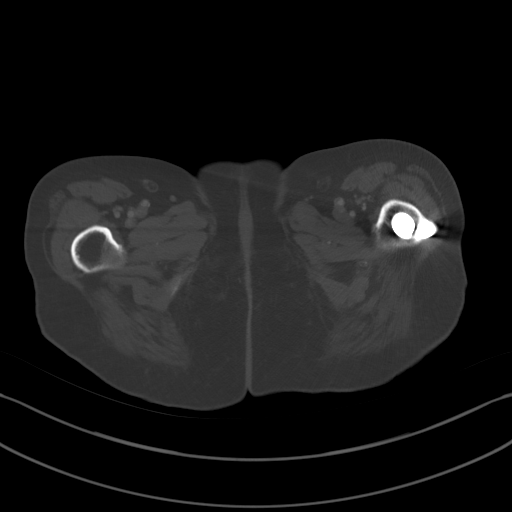
[im 11/87  soft-tissue]
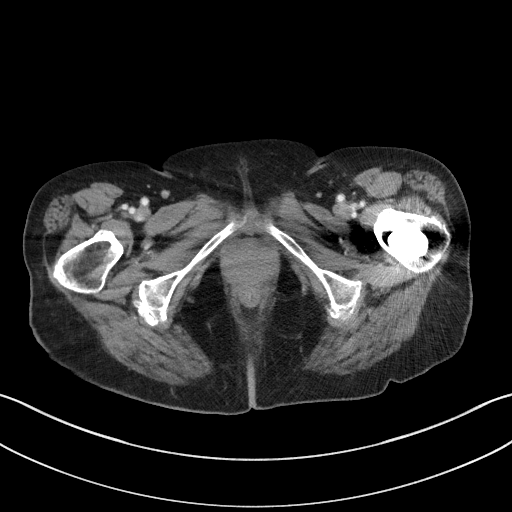
[im 21/87  soft-tissue]
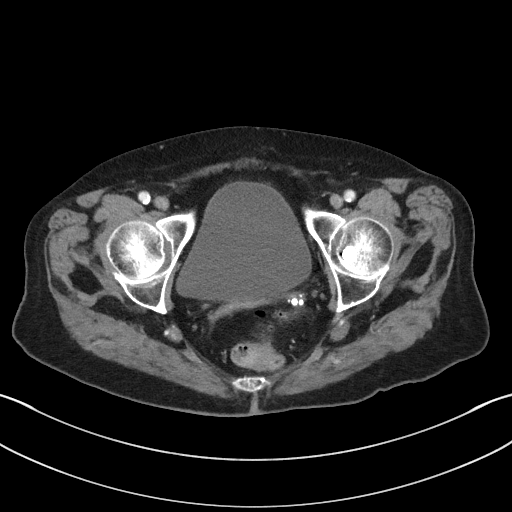
[im 26/87  soft-tissue]
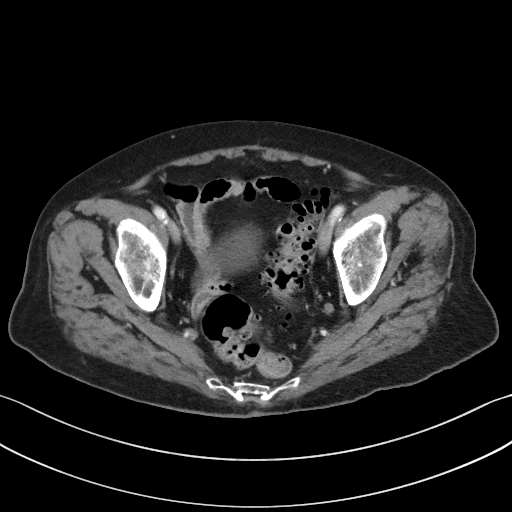
[im 31/87  soft-tissue]
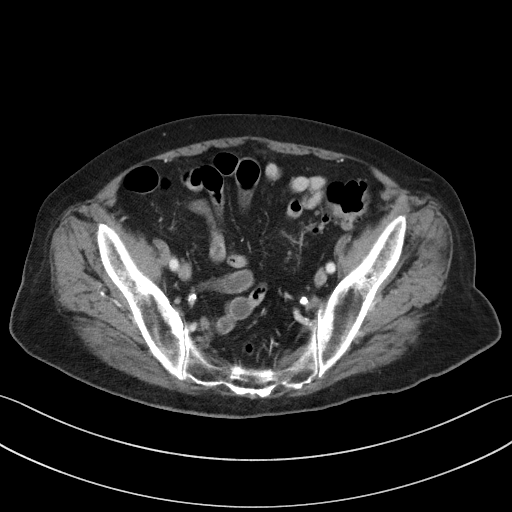
[im 36/87  soft-tissue]
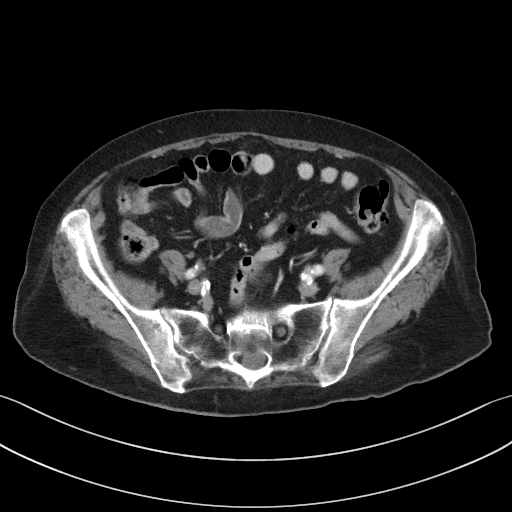
[im 46/87  soft-tissue]
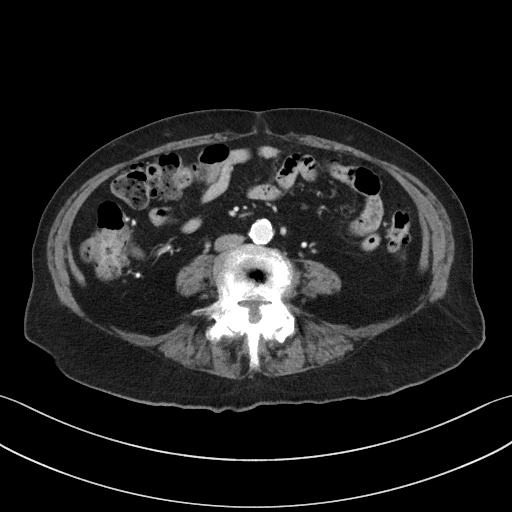
[im 51/87  soft-tissue]
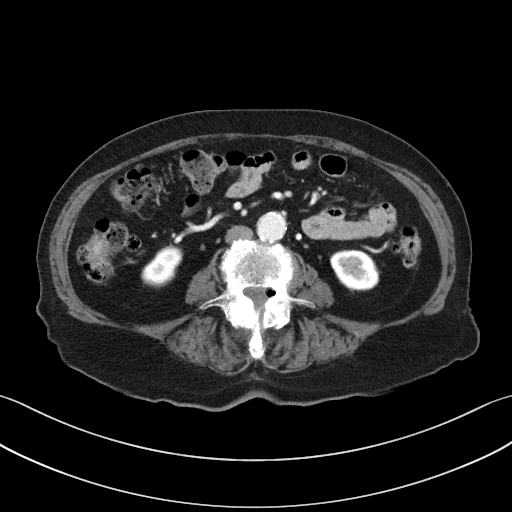
[im 56/87  soft-tissue]
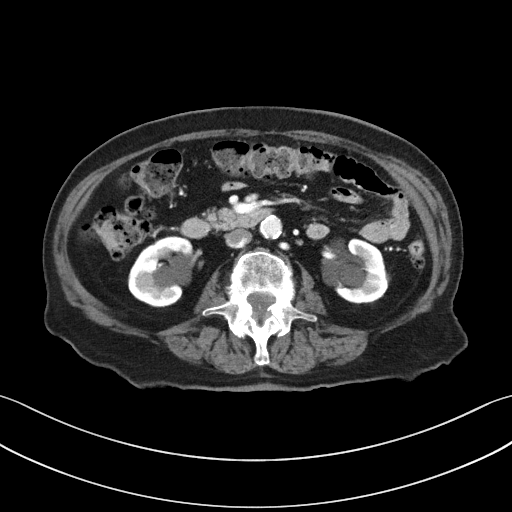
[im 56/87  bone]
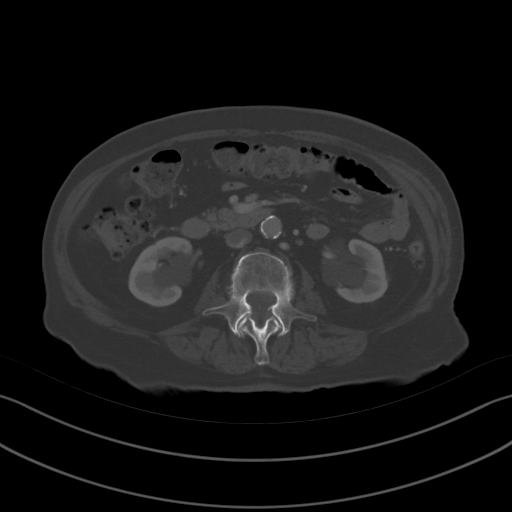
[im 61/87  soft-tissue]
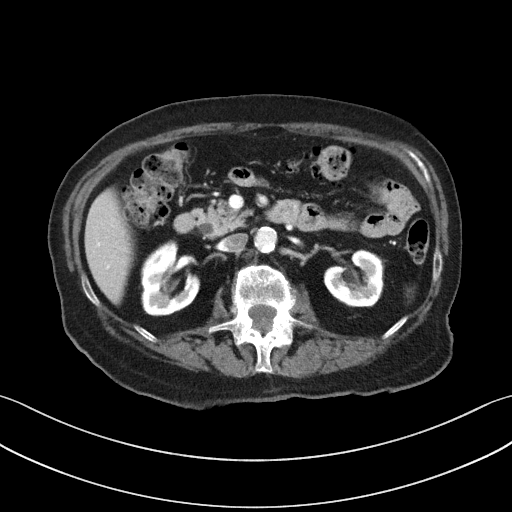
[im 66/87  soft-tissue]
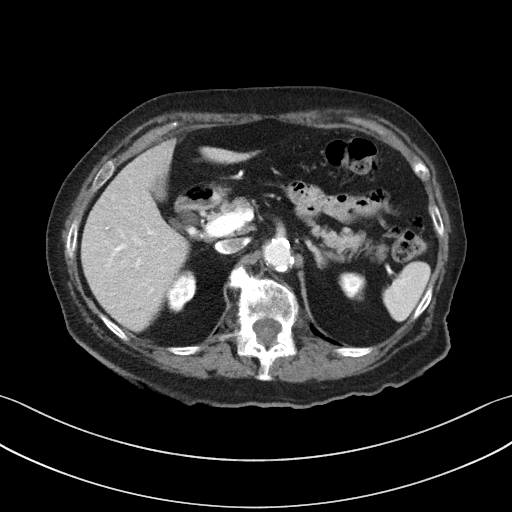
[im 76/87  soft-tissue]
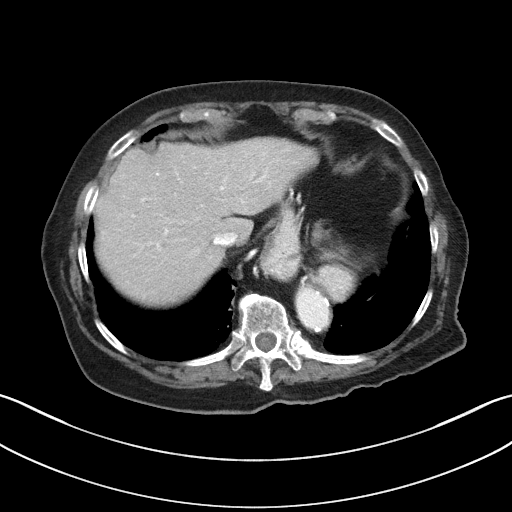
[im 81/87  soft-tissue]
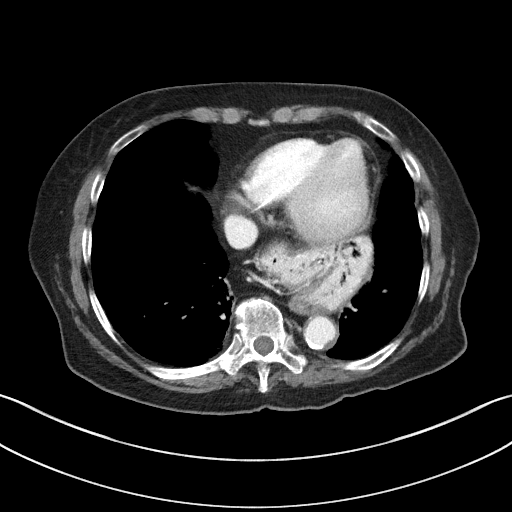

[Series 5: coronal st · coronal · 0.68mm/px · 3 of 101 slices shown]
[im 34/101  soft-tissue]
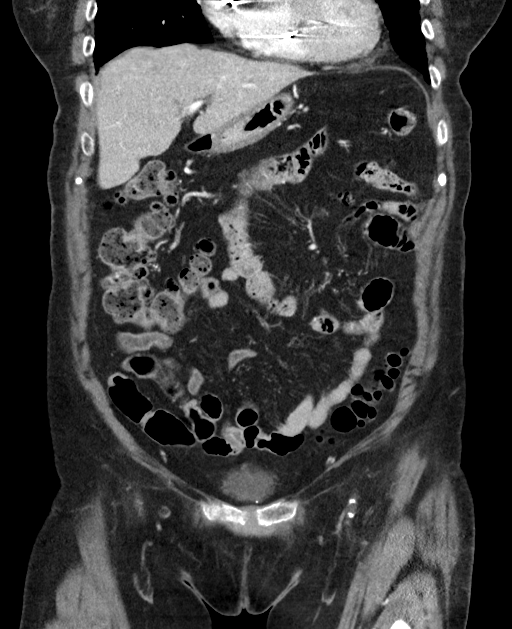
[im 45/101  soft-tissue]
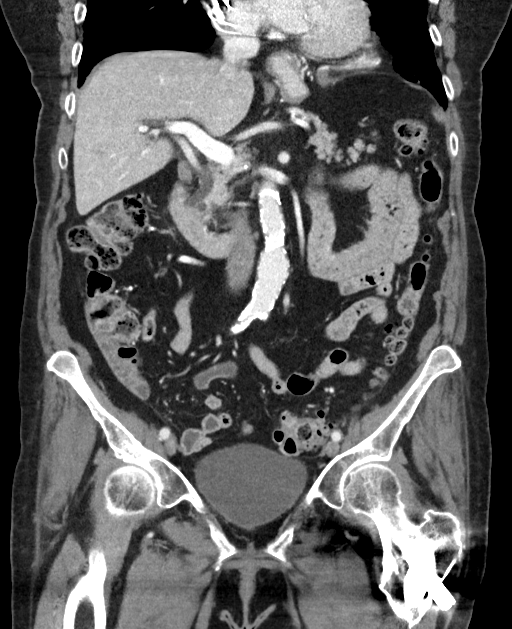
[im 56/101  soft-tissue]
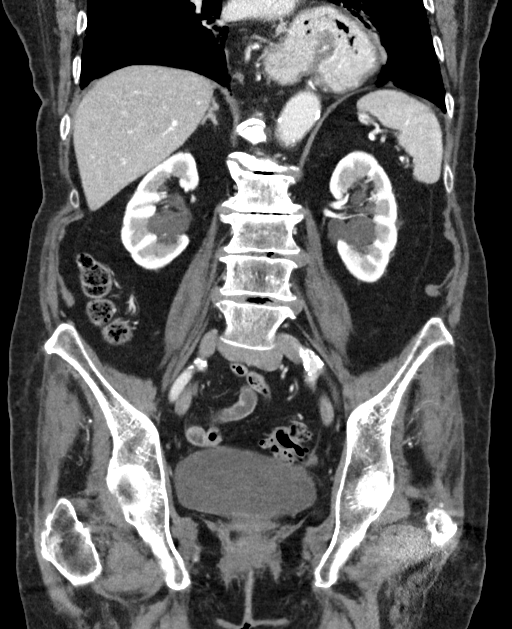

[16 of 46 positions shown; findings below may reference images not displayed]

FINDINGS: Lower chest: No acute abnormality.  Pacemaker leads noted.

Hepatobiliary: The liver is unremarkable. Patient is status post
cholecystectomy. No biliary dilatation.

Pancreas: Unremarkable

Spleen: Unremarkable

Adrenals/Urinary Tract: The kidneys, adrenal glands and bladder are
unremarkable except for bilateral renal cysts.

Stomach/Bowel: A large hiatal hernia is noted. There is no evidence
of bowel obstruction, definite bowel wall thickening or inflammatory
changes. Colonic diverticulosis noted without diverticulitis.

Vascular/Lymphatic: Aortic atherosclerosis. No enlarged abdominal or
pelvic lymph nodes.

Reproductive: Status post hysterectomy. No adnexal masses.

Other: No ascites, abscess or pneumoperitoneum.

Musculoskeletal: No acute or suspicious bony abnormalities.
Multilevel degenerative disc disease, spondylosis and facet
arthropathy within the lumbar spine noted.
IMPRESSION: 1. No acute abnormality
2. Large hiatal hernia.
3. Aortic Atherosclerosis ([id number]-[id number].D).

## 2018-10-27 ENCOUNTER — Encounter: Payer: Self-pay | Admitting: Cardiology

## 2018-10-27 ENCOUNTER — Ambulatory Visit (INDEPENDENT_AMBULATORY_CARE_PROVIDER_SITE_OTHER): Payer: PPO | Admitting: Cardiology

## 2018-10-27 ENCOUNTER — Other Ambulatory Visit: Payer: Self-pay

## 2018-10-27 VITALS — BP 138/72 | HR 82 | Ht 61.0 in | Wt 123.8 lb

## 2018-10-27 DIAGNOSIS — I495 Sick sinus syndrome: Secondary | ICD-10-CM

## 2018-10-27 DIAGNOSIS — Z950 Presence of cardiac pacemaker: Secondary | ICD-10-CM

## 2018-10-27 DIAGNOSIS — I4430 Unspecified atrioventricular block: Secondary | ICD-10-CM | POA: Diagnosis not present

## 2018-10-27 NOTE — Progress Notes (Signed)
Electrophysiology Office Note   Date:  10/27/2018   ID:  Jarrett, Kortan 1919/05/16, MRN [id number]  PCP:  Ronita Hipps, MD  Cardiologist:  Bettina Gavia Primary Electrophysiologist:  Antwane Grose Meredith Leeds, MD    No chief complaint on file.    History of Present Illness: Maria Schmidt is a 85 y.o. female who is being seen today for the evaluation of sick sinus syndrome at the request of Bettina Gavia, Hilton Cork, MD. Presenting today for electrophysiology evaluation.  She has a history of sick sinus syndrome status post [employer] dual-chamber pacemaker, hypertension, hyperlipidemia, coronary artery disease.  Today, she denies symptoms of palpitations, chest pain, shortness of breath, orthopnea, PND, lower extremity edema, claudication, dizziness, presyncope, syncope, bleeding, or neurologic sequela. The patient is tolerating medications without difficulties.  She currently feels well.  She has no chest pain.  To all of her daily activities.  She lives alone and the only thing she needs help with is yard work.   Past Medical History:  Diagnosis Date  . Coronary artery disease   . History of second degree heart block   . Hyperlipidemia   . Hypertension   . Hypothyroidism    Past Surgical History:  Procedure Laterality Date  . ABDOMINAL HYSTERECTOMY    . BREAST LUMPECTOMY    . CARDIAC CATHETERIZATION    . CHOLECYSTECTOMY    . INSERT / REPLACE / REMOVE PACEMAKER    . Stent to LAD  2011     Current Outpatient Medications  Medication Sig Dispense Refill  . ENSURE (ENSURE) Take 237 mLs by mouth daily.    Marland Kitchen levothyroxine (SYNTHROID, LEVOTHROID) 50 MCG tablet Take 50 mcg by mouth daily.  3  . nitroGLYCERIN (NITROSTAT) 0.4 MG SL tablet Place 1 tablet (0.4 mg total) under the tongue every 5 (five) minutes as needed for chest pain. 25 tablet 5  . ondansetron (ZOFRAN) 4 MG tablet Take 4 mg by mouth every 8 (eight) hours as needed. for nausea    . ranolazine (RANEXA) 500 MG 12 hr tablet Take 1  tablet (500 mg total) by mouth 2 (two) times daily. 60 tablet 2  . sucralfate (CARAFATE) 1 g tablet TAKE 1 TABLET BY MOUTH TWICE DAILY ON AN EMPTY STOMACH     No current facility-administered medications for this visit.     Allergies:   Lipitor [atorvastatin]   Social History:  The patient  reports that she has never smoked. She has never used smokeless tobacco. She reports that she does not drink alcohol or use drugs.   Family History:  The patient's family history includes Cancer in her father; Heart attack in her father.    ROS:  Please see the history of present illness.   Otherwise, review of systems is positive for none.   All other systems are reviewed and negative.    PHYSICAL EXAM: VS:  BP 138/72   Pulse 82   Ht 5\' 1"  (1.549 m)   Wt 123 lb 12.8 oz (56.2 kg)   SpO2 97%   BMI 23.39 kg/m  , BMI Body mass index is 23.39 kg/m. GEN: Well nourished, well developed, in no acute distress  HEENT: normal  Neck: no JVD, carotid bruits, or masses Cardiac: RRR; no murmurs, rubs, or gallops,no edema  Respiratory:  clear to auscultation bilaterally, normal work of breathing GI: soft, nontender, nondistended, + BS MS: no deformity or atrophy  Skin: warm and dry, device pocket is well healed Neuro:  Strength and  sensation are intact Psych: euthymic mood, full affect  EKG:  EKG is ordered today. Personal review of the ekg ordered shows AV paced  Device interrogation is reviewed today in detail.  See PaceArt for details.   Recent Labs: No results found for requested labs within last 8760 hours.    Lipid Panel  No results found for: CHOL, TRIG, HDL, CHOLHDL, VLDL, LDLCALC, LDLDIRECT   Wt Readings from Last 3 Encounters:  10/27/18 123 lb 12.8 oz (56.2 kg)  03/04/18 126 lb 6 oz (57.3 kg)  08/27/17 125 lb 10.6 oz (57 kg)      Other studies Reviewed: Additional studies/ records that were reviewed today include: TTE 07/03/2017 Review of the above records today demonstrates:   mild global LV systolic dysfunction No significant Doppler findings. No significant valvular abnormalities. Ejection fraction 40 to 45%   ASSESSMENT AND PLAN:  1.  Sick sinus syndrome: Status post [employer] dual-chamber pacemaker.  Device functioning appropriately.  No changes.  2.  Coronary artery disease: Plan per primary cardiology.  No current chest pain.  3.  Hypertension: Mildly elevated today.  Has been well controlled in the past.  No changes.   Current medicines are reviewed at length with the patient today.   The patient does not have concerns regarding her medicines.  The following changes were made today:  none  Labs/ tests ordered today include:  Orders Placed This Encounter  Procedures  . EKG 12-Lead     Disposition:   FU with Charlea Nardo 1 year  Signed, Reily Ilic Meredith Leeds, MD  10/27/2018 11:12 AM     [facility] [address] [place] [address] [phone] (office) [phone] (fax)

## 2018-10-27 NOTE — Patient Instructions (Addendum)
Medication Instructions:  Your physician recommends that you continue on your current medications as directed. Please refer to the Current Medication list given to you today.  *If you need a refill on your cardiac medications before your next appointment, please call your pharmacy*  Labwork: None ordered  Testing/Procedures: None ordered  Follow-Up: Remote monitoring is used to monitor your Pacemaker or ICD from home. This monitoring reduces the number of office visits required to check your device to one time per year. It allows [place] to keep an eye on the functioning of your device to ensure it is working properly. You are scheduled for a device check from home on 01/26/19. You may send your transmission at any time that day. If you have a wireless device, the transmission will be sent automatically. After your physician reviews your transmission, you will receive a postcard with your next transmission date.  Your physician wants you to follow-up in: 1 year with Dr. Curt Bears.  You will receive a reminder letter in the mail two months in advance. If you don't receive a letter, please call our office to schedule the follow-up appointment.  Thank you for choosing [facility]!!   Trinidad Curet, RN [phone]

## 2018-11-12 DIAGNOSIS — Z1331 Encounter for screening for depression: Secondary | ICD-10-CM | POA: Diagnosis not present

## 2018-11-12 DIAGNOSIS — K5900 Constipation, unspecified: Secondary | ICD-10-CM | POA: Diagnosis not present

## 2018-11-12 DIAGNOSIS — Z6821 Body mass index (BMI) 21.0-21.9, adult: Secondary | ICD-10-CM | POA: Diagnosis not present

## 2019-01-02 LAB — CUP PACEART INCLINIC DEVICE CHECK
Battery Impedance: 2013 Ohm
Battery Remaining Longevity: 30 mo
Battery Voltage: 2.75 V
Brady Statistic AP VP Percent: 82 %
Brady Statistic AP VS Percent: 0 %
Brady Statistic AS VP Percent: 18 %
Brady Statistic AS VS Percent: 0 %
Date Time Interrogation Session: 20200831135323
Implantable Lead Implant Date: 20120906
Implantable Lead Implant Date: 20120906
Implantable Lead Location: 753859
Implantable Lead Location: 753860
Implantable Lead Model: 4076
Implantable Lead Model: 5076
Implantable Pulse Generator Implant Date: 20120906
Lead Channel Impedance Value: 521 Ohm
Lead Channel Impedance Value: 562 Ohm
Lead Channel Pacing Threshold Amplitude: 0.5 V
Lead Channel Pacing Threshold Amplitude: 0.75 V
Lead Channel Pacing Threshold Pulse Width: 0.4 ms
Lead Channel Pacing Threshold Pulse Width: 0.4 ms
Lead Channel Sensing Intrinsic Amplitude: 4 mV
Lead Channel Setting Pacing Amplitude: 1.5 V
Lead Channel Setting Pacing Amplitude: 2.5 V
Lead Channel Setting Pacing Pulse Width: 0.4 ms
Lead Channel Setting Sensing Sensitivity: 4 mV

## 2019-02-09 ENCOUNTER — Other Ambulatory Visit: Payer: Self-pay | Admitting: Cardiology

## 2019-02-12 NOTE — Telephone Encounter (Signed)
Rx refill sent to pharmacy. 

## 2019-02-18 ENCOUNTER — Telehealth: Payer: Self-pay | Admitting: *Deleted

## 2019-02-18 NOTE — Telephone Encounter (Signed)
Maria Schmidt states the pt did receive her monitor. He know for sure because he was there. He states he will make sure it get done soon. I gave him my direct office number to call if he needs help.

## 2019-02-18 NOTE — Telephone Encounter (Signed)
LMOVM for Maria Schmidt requesting call back regarding overdue PPM transmission. New monitor ordered 01/15/19.

## 2019-02-18 NOTE — Telephone Encounter (Signed)
Pt called stating she wants to wait until someone is with her to show her how to send the transmission. She said if she can not get her neighbor to do it she will take it to her appointment with Dr. Bettina Gavia office.

## 2019-02-24 NOTE — Telephone Encounter (Signed)
I spoke with the pt and she states her neighbor comes home from the hospital this afternoon. She states she would like to try and do it after the new years.

## 2019-02-25 NOTE — Progress Notes (Signed)
Cardiology Office Note:    Date:  02/26/2019   ID:  Maria Schmidt, DOB 1919-08-28, MRN [id number]  PCP:  Ronita Hipps, MD  Cardiologist:  Shirlee More, MD    Referring MD: Ronita Hipps, MD    ASSESSMENT:    1. Palpitations   2. Heart block atrioventricular   3. Coronary artery disease of native artery of native heart with stable angina pectoris ([place])   4. Essential hypertension   5. Dyslipidemia    PLAN:    In order of problems listed above:  1. She has had a documented atrial tachycardia paroxysmal atrial fibrillation is quite symptomatic and will use low-dose amiodarone with a backup pacemaker in place is the most effective way to palliate her symptomatic arrhythmia.  With her previous GI bleeding I would not revisit the issue of anticoagulation.  She has telemetry being set up early next week and I think he gives [place] a higher degree of safety and I will see her back in my office in 3 to 4 weeks to assess response 2. Normal pacemaker function 3. Stable BP at target 4. At present not on lipid-lowering treatment I think that is reasonable at age 55   Next appointment: 3 to 4 weeks   Medication Adjustments/Labs and Tests Ordered: Current medicines are reviewed at length with the patient today.  Concerns regarding medicines are outlined above.  Orders Placed This Encounter  Procedures  . Comprehensive Metabolic Panel (CMET)  . TSH+T4F+T3Free  . EKG 12-Lead   Meds ordered this encounter  Medications  . amiodarone (PACERONE) 200 MG tablet    Sig: Take 1 tablet (200 mg total) by mouth daily.    Dispense:  90 tablet    Refill:  3    Chief Complaint  Patient presents with  . Follow-up    pacemaker  . Coronary Artery Disease  . Hyperlipidemia  . Hypertension    History of Present Illness:    Maria Schmidt is a 85 y.o. female with a hx of CAD bare-metal stent to left anterior descending 2011 EF 70% dyslipidemia hypertension and sick sinus syndrome with a  Medtronic pacemaker last seen 03/04/2018. Compliance with diet, lifestyle and medications: Yes  I reviewed her device download she is having frequent mode switching complains bitterly that at night her heart races and makes her apprehensive and weak.  Looking at the total situation I think her best served by maintaining sinus rhythm medicating symptoms she has a backup pacemaker in place and I think the safest approach is to put her on low-dose amiodarone.  She tells me after the first of the year she will have home telemetry.  I will go ahead and check a CMP for liver function and check thyroid test to be sure she is not thyrotoxic and plan to see her back in the office in 3 to 4 weeks.  She is not a candidate for anticoagulation because of previous GI bleeding outside of these episodes she feels well and is not having chest pain edema shortness of breath no TIA or syncope.  She takes ranolazine has not had no further angina Past Medical History:  Diagnosis Date  . Coronary artery disease   . History of second degree heart block   . Hyperlipidemia   . Hypertension   . Hypothyroidism     Past Surgical History:  Procedure Laterality Date  . ABDOMINAL HYSTERECTOMY    . BREAST LUMPECTOMY    . CARDIAC CATHETERIZATION    .  CHOLECYSTECTOMY    . INSERT / REPLACE / REMOVE PACEMAKER    . Stent to LAD  2011    Current Medications: Current Meds  Medication Sig  . acetaminophen (TYLENOL) 500 MG tablet Take 500 mg by mouth as needed.  . ASPIRIN 81 PO Take by mouth daily.  Marland Kitchen levothyroxine (SYNTHROID, LEVOTHROID) 50 MCG tablet Take 50 mcg by mouth daily.  . Multiple Vitamin (MULTIVITAMIN) tablet Take 1 tablet by mouth daily.  . nitroGLYCERIN (NITROSTAT) 0.4 MG SL tablet Place 1 tablet (0.4 mg total) under the tongue every 5 (five) minutes as needed for chest pain.  . pantoprazole (PROTONIX) 40 MG tablet   . ranolazine (RANEXA) 500 MG 12 hr tablet Take 1 tablet by mouth twice daily  . sucralfate  (CARAFATE) 1 g tablet TAKE 1 TABLET BY MOUTH TWICE DAILY ON AN EMPTY STOMACH     Allergies:   Lipitor [atorvastatin]   Social History   Socioeconomic History  . Marital status: Widowed    Spouse name: Not on file  . Number of children: Not on file  . Years of education: Not on file  . Highest education level: Not on file  Occupational History  . Not on file  Tobacco Use  . Smoking status: Never Smoker  . Smokeless tobacco: Never Used  Substance and Sexual Activity  . Alcohol use: Never  . Drug use: Never  . Sexual activity: Not Currently  Other Topics Concern  . Not on file  Social History Narrative  . Not on file   Social Determinants of Health   Financial Resource Strain:   . Difficulty of Paying Living Expenses: Not on file  Food Insecurity:   . Worried About Charity fundraiser in the Last Year: Not on file  . Ran Out of Food in the Last Year: Not on file  Transportation Needs:   . Lack of Transportation (Medical): Not on file  . Lack of Transportation (Non-Medical): Not on file  Physical Activity:   . Days of Exercise per Week: Not on file  . Minutes of Exercise per Session: Not on file  Stress:   . Feeling of Stress : Not on file  Social Connections:   . Frequency of Communication with Friends and Family: Not on file  . Frequency of Social Gatherings with Friends and Family: Not on file  . Attends Religious Services: Not on file  . Active Member of Clubs or Organizations: Not on file  . Attends Archivist Meetings: Not on file  . Marital Status: Not on file     Family History: The patient's family history includes Cancer in her father; Heart attack in her father. ROS:   Please see the history of present illness.    All other systems reviewed and are negative.  EKGs/Labs/Other Studies Reviewed:    The following studies were reviewed today:  EKG:  EKG ordered today and personally reviewed.  The ekg ordered today demonstrates dual-chamber  pacemaker rhythm Pacemaker check performed 10/27/2018: Conclusion  Pacemaker check in clinic. Normal device function. Thresholds, sensing, impedances consistent with previous measurements. Device programmed to maximize longevity. 445 mode switches (<0.1%)--AT and AF, longest 15.26min, some atrial markers non-physiologic,  RA sensitivity reduced to 1.16mV (P-waves measure 4+ mV) and RA sensing assurance off. No high ventricular rates noted. Device programmed at appropriate safety margins; RV min output increased to 2.5V, RV sensitivity reduced to 4.59mV due to dependency,  RV sensing assurance off. Histogram distribution blunted but appropriate  for patient activity level. Estimated longevity 2.5 years. Patient enrolled in remote follow-up, educated about importance of compliance with remote monitoring. Patient education  completed. [facility] on 01/26/19 and ROV with WC/A in 1 year.Levander Campion BSN, RN, CCDS    Recent Labs: 10/17/2018: Creatinine 0.9 TSH 2.84  Physical Exam:    VS:  BP 118/60   Pulse 80   Resp (!) 97   Ht 5\' 1"  (1.549 m)   Wt 122 lb 9.6 oz (55.6 kg)   BMI 23.17 kg/m     Wt Readings from Last 3 Encounters:  02/26/19 122 lb 9.6 oz (55.6 kg)  10/27/18 123 lb 12.8 oz (56.2 kg)  03/04/18 126 lb 6 oz (57.3 kg)     GEN: She looks somewhat frail and anxious well nourished, well developed in no acute distress HEENT: Normal NECK: No JVD; No carotid bruits LYMPHATICS: No lymphadenopathy CARDIAC: RRR, no murmurs, rubs, gallops RESPIRATORY:  Clear to auscultation without rales, wheezing or rhonchi  ABDOMEN: Soft, non-tender, non-distended MUSCULOSKELETAL:  No edema; No deformity  SKIN: Warm and dry NEUROLOGIC:  Alert and oriented x 3 PSYCHIATRIC:  Normal affect    Signed, Shirlee More, MD  02/26/2019 4:29 PM    [facility]

## 2019-02-26 ENCOUNTER — Other Ambulatory Visit: Payer: Self-pay

## 2019-02-26 ENCOUNTER — Ambulatory Visit (INDEPENDENT_AMBULATORY_CARE_PROVIDER_SITE_OTHER): Payer: PPO | Admitting: Cardiology

## 2019-02-26 ENCOUNTER — Encounter: Payer: Self-pay | Admitting: Cardiology

## 2019-02-26 VITALS — BP 118/60 | HR 80 | Resp 97 | Ht 61.0 in | Wt 122.6 lb

## 2019-02-26 DIAGNOSIS — E785 Hyperlipidemia, unspecified: Secondary | ICD-10-CM | POA: Diagnosis not present

## 2019-02-26 DIAGNOSIS — R002 Palpitations: Secondary | ICD-10-CM | POA: Diagnosis not present

## 2019-02-26 DIAGNOSIS — I10 Essential (primary) hypertension: Secondary | ICD-10-CM

## 2019-02-26 DIAGNOSIS — I25118 Atherosclerotic heart disease of native coronary artery with other forms of angina pectoris: Secondary | ICD-10-CM | POA: Diagnosis not present

## 2019-02-26 DIAGNOSIS — I4430 Unspecified atrioventricular block: Secondary | ICD-10-CM | POA: Diagnosis not present

## 2019-02-26 DIAGNOSIS — M1712 Unilateral primary osteoarthritis, left knee: Secondary | ICD-10-CM | POA: Diagnosis not present

## 2019-02-26 MED ORDER — AMIODARONE HCL 200 MG PO TABS: 200.0000 mg | ORAL_TABLET | Freq: Every day | ORAL | 3 refills | Status: DC

## 2019-02-26 NOTE — Patient Instructions (Signed)
Medication Instructions:  Your physician has recommended you make the following change in your medication:   START taking amiodarone 200mg  (1 tablet) once daily  STOP using electric blanket  *If you need a refill on your cardiac medications before your next appointment, please call your pharmacy*  Lab Work: Your physician recommends that you have a CMET, TSH, T4 and T3 drawn today  If you have labs (blood work) drawn today and your tests are completely normal, you will receive your results only by: Marland Kitchen MyChart Message (if you have MyChart) OR . A paper copy in the mail If you have any lab test that is abnormal or we need to change your treatment, we will call you to review the results.  Testing/Procedures: You had an EKG Performed today  Follow-Up: At [facility], you and your health needs are our priority.  As part of our continuing mission to provide you with exceptional heart care, we have created designated Provider Care Teams.  These Care Teams include your primary Cardiologist (physician) and Advanced Practice Providers (APPs -  Physician Assistants and Nurse Practitioners) who all work together to provide you with the care you need, when you need it.  Your next appointment:   3 week(s)  The format for your next appointment:   In Person  Provider:   Shirlee More, MD  Other Instructions Amiodarone tablets What is this medicine? AMIODARONE (a MEE oh da rone) is an antiarrhythmic drug. It helps make your heart beat regularly. Because of the side effects caused by this medicine, it is only used when other medicines have not worked. It is usually used for heartbeat problems that may be life threatening. This medicine may be used for other purposes; ask your health care provider or pharmacist if you have questions. COMMON BRAND NAME(S): Cordarone, Pacerone What should I tell my health care provider before I take this medicine? They need to know if you have any of these  conditions:  liver disease  lung disease  other heart problems  thyroid disease  an unusual or allergic reaction to amiodarone, iodine, other medicines, foods, dyes, or preservatives  pregnant or trying to get pregnant  breast-feeding How should I use this medicine? Take this medicine by mouth with a glass of water. Follow the directions on the prescription label. You can take this medicine with or without food. However, you should always take it the same way each time. Take your doses at regular intervals. Do not take your medicine more often than directed. Do not stop taking except on the advice of your doctor or health care professional. A special MedGuide will be given to you by the pharmacist with each prescription and refill. Be sure to read this information carefully each time. Talk to your pediatrician regarding the use of this medicine in children. Special care may be needed. Overdosage: If you think you have taken too much of this medicine contact a poison control center or emergency room at once. NOTE: This medicine is only for you. Do not share this medicine with others. What if I miss a dose? If you miss a dose, take it as soon as you can. If it is almost time for your next dose, take only that dose. Do not take double or extra doses. What may interact with this medicine? Do not take this medicine with any of the following medications:  abarelix  apomorphine  arsenic trioxide  certain antibiotics like erythromycin, gemifloxacin, levofloxacin, pentamidine  certain medicines for depression  like amoxapine, tricyclic antidepressants  certain medicines for fungal infections like fluconazole, itraconazole, ketoconazole, posaconazole, voriconazole  certain medicines for irregular heart beat like disopyramide, dronedarone, ibutilide, propafenone, sotalol  certain medicines for malaria like chloroquine,  halofantrine  cisapride  droperidol  haloperidol  [place]  maprotiline  methadone  phenothiazines like chlorpromazine, mesoridazine, thioridazine  pimozide  ranolazine  red yeast rice  vardenafil This medicine may also interact with the following medications:  antiviral medicines for HIV or AIDS  certain medicines for blood pressure, heart disease, irregular heart beat  certain medicines for cholesterol like atorvastatin, cerivastatin, lovastatin, simvastatin  certain medicines for hepatitis C like sofosbuvir and ledipasvir; sofosbuvir  certain medicines for seizures like phenytoin  certain medicines for thyroid problems  certain medicines that treat or prevent blood clots like warfarin  cholestyramine  cimetidine  clopidogrel  cyclosporine  dextromethorphan  diuretics  dofetilide  fentanyl  general anesthetics  grapefruit juice  lidocaine  loratadine  methotrexate  other medicines that prolong the QT interval (cause an abnormal heart rhythm)  procainamide  quinidine  rifabutin, rifampin, or rifapentine  St. John's Wort  trazodone  ziprasidone This list may not describe all possible interactions. Give your health care provider a list of all the medicines, herbs, non-prescription drugs, or dietary supplements you use. Also tell them if you smoke, drink alcohol, or use illegal drugs. Some items may interact with your medicine. What should I watch for while using this medicine? Your condition will be monitored closely when you first begin therapy. Often, this drug is first started in a hospital or other monitored health care setting. Once you are on maintenance therapy, visit your doctor or health care professional for regular checks on your progress. Because your condition and use of this medicine carry some risk, it is a good idea to carry an identification card, necklace or bracelet with details of your condition, medications, and  doctor or health care professional. Dennis Bast may get drowsy or dizzy. Do not drive, use machinery, or do anything that needs mental alertness until you know how this medicine affects you. Do not stand or sit up quickly, especially if you are an older patient. This reduces the risk of dizzy or fainting spells. This medicine can make you more sensitive to the sun. Keep out of the sun. If you cannot avoid being in the sun, wear protective clothing and use sunscreen. Do not use sun lamps or tanning beds/booths. You should have regular eye exams before and during treatment. Call your doctor if you have blurred vision, see halos, or your eyes become sensitive to light. Your eyes may get dry. It may be helpful to use a lubricating eye solution or artificial tears solution. If you are going to have surgery or a procedure that requires contrast dyes, tell your doctor or health care professional that you are taking this medicine. What side effects may I notice from receiving this medicine? Side effects that you should report to your doctor or health care professional as soon as possible:  allergic reactions like skin rash, itching or hives, swelling of the face, lips, or tongue  blue-gray coloring of the skin  blurred vision, seeing blue green halos, increased sensitivity of the eyes to light  breathing problems  chest pain  dark urine  fast, irregular heartbeat  feeling faint or light-headed  intolerance to heat or cold  nausea or vomiting  pain and swelling of the scrotum  pain, tingling, numbness in feet, hands  redness, blistering,  peeling or loosening of the skin, including inside the mouth  spitting up blood  stomach pain  sweating  unusual or uncontrolled movements of body  unusually weak or tired  weight gain or loss  yellowing of the eyes or skin Side effects that usually do not require medical attention (report to your doctor or health care professional if they continue or  are bothersome):  change in sex drive or performance  constipation  dizziness  headache  loss of appetite  trouble sleeping This list may not describe all possible side effects. Call your doctor for medical advice about side effects. You may report side effects to FDA at 1-800-FDA-1088. Where should I keep my medicine? Keep out of the reach of children. Store at room temperature between 20 and 25 degrees C (68 and 77 degrees F). Protect from light. Keep container tightly closed. Throw away any unused medicine after the expiration date. NOTE: This sheet is a summary. It may not cover all possible information. If you have questions about this medicine, talk to your doctor, pharmacist, or health care provider.  2020 Elsevier/Gold Standard (2018-01-15 13:44:04)

## 2019-02-27 LAB — COMPREHENSIVE METABOLIC PANEL
ALT: 14 IU/L (ref 0–32)
AST: 27 IU/L (ref 0–40)
Albumin/Globulin Ratio: 2 (ref 1.2–2.2)
Albumin: 4.5 g/dL (ref 3.5–4.6)
Alkaline Phosphatase: 95 IU/L (ref 39–117)
BUN/Creatinine Ratio: 21 (ref 12–28)
BUN: 22 mg/dL (ref 10–36)
Bilirubin Total: <0.2 mg/dL (ref 0.0–1.2)
CO2: 22 mmol/L (ref 20–29)
Calcium: 10 mg/dL (ref 8.7–10.3)
Chloride: 103 mmol/L (ref 96–106)
Creatinine, Ser: 1.05 mg/dL — ABNORMAL HIGH (ref 0.57–1.00)
GFR calc Af Amer: 51 mL/min/{1.73_m2} — ABNORMAL LOW (ref >59)
GFR calc non Af Amer: 45 mL/min/{1.73_m2} — ABNORMAL LOW (ref >59)
Globulin, Total: 2.2 g/dL (ref 1.5–4.5)
Glucose: 136 mg/dL — ABNORMAL HIGH (ref 65–99)
Potassium: 4.8 mmol/L (ref 3.5–5.2)
Sodium: 141 mmol/L (ref 134–144)
Total Protein: 6.7 g/dL (ref 6.0–8.5)

## 2019-02-27 LAB — TSH+T4F+T3FREE
Free T4: 1.35 ng/dL (ref 0.82–1.77)
T3, Free: 2.6 pg/mL (ref 2.0–4.4)
TSH: 2.96 u[IU]/mL (ref 0.450–4.500)

## 2019-03-10 NOTE — Telephone Encounter (Signed)
I spoke with the pt son and he agreed to send a manual transmission with the monitor tomorrow for the pt. I gave him my direct office number to get help with the transmissions.

## 2019-03-16 NOTE — Telephone Encounter (Signed)
I spoke with the pt and she states her son could not figure out how to use the monitor. I told her if she plug the monitor in and call me I can talk her through sending the transmission. She states she see Dr. Bettina Gavia on 03/26/2019 and she wants to take it to them to get help sending the transmission. I told her if she change her mind just to call me and I will be more than happy to help her. The pt verbalized understanding.

## 2019-03-25 NOTE — Progress Notes (Signed)
Cardiology Office Note:    Date:  03/26/2019   ID:  Maria Schmidt, DOB 10-17-1919, MRN [id number]  PCP:  Ronita Hipps, MD  Cardiologist:  Shirlee More, MD    Referring MD: Ronita Hipps, MD    ASSESSMENT:    1. PAT (paroxysmal atrial tachycardia) ([place])   2. SSS (sick sinus syndrome) ([facility])   3. Heart block atrioventricular   4. Pacemaker   5. Essential hypertension    PLAN:    In order of problems listed above:  1. She is continued have episodes of rapid heart rhythm for which she is taking amiodarone she will reconsider and try to mitigate symptoms upon a low-dose selective beta-blocker at bedtime.  Will monitor rhythm through the device.  Also has had atrial fibrillation and she is not anticoagulated. 2. Stable bradycardia is addressed with pacemaker function followed in device clinic 3. Hypertension stable BP at target 4. Hypothyroidism stable continue current supplement TSH in range 5. Stable CAD taking aspirin and ranolazine having no angina    Next appointment: 6 months   Medication Adjustments/Labs and Tests Ordered: Current medicines are reviewed at length with the patient today.  Concerns regarding medicines are outlined above.  No orders of the defined types were placed in this encounter.  No orders of the defined types were placed in this encounter.   Chief Complaint  Patient presents with  . Follow-up    On amiodarone with paroxysmal atrial tachycardia and pacemaker  . Coronary Artery Disease  . Hypertension  . Hyperlipidemia    History of Present Illness:    Maria Schmidt is a 86 y.o. female with a hx of CAD bare-metal stent to left anterior descending 2011 EF 70% dyslipidemia hypertension and sick sinus syndrome with a Medtronic pacemaker  and paroxysmal atrial tachycardia and atrial fibrillation initiated on low-dose amiodarone.  She is not anticoagulated with his GI bleed. She wants last seen 02/26/2019. Compliance with diet, lifestyle and  medications: Yes  Is a tough visit when I got in the room it is obvious that she never started taking amiodarone.  She still notices her heart racing it usually at nighttime but she thinks by moderating caffeine and chocolate it occurs less frequently.  I reviewed with her my concern that she will get caught atrial fibrillation poor quality of life hospitalization and/or stroke and convinced her to try taking a low-dose of a selective beta-blocker at bedtime.  She is not anticoagulated because of GI bleeding and with our device to our clinic.  She has CAD but is having no anginal discomfort has not had syncope no edema or shortness of breath.  Recent labs include a therapeutic TSH level and thyroid supplement.  She also complains of heartburn and indigestion. Past Medical History:  Diagnosis Date  . Coronary artery disease   . History of second degree heart block   . Hyperlipidemia   . Hypertension   . Hypothyroidism     Past Surgical History:  Procedure Laterality Date  . ABDOMINAL HYSTERECTOMY    . BREAST LUMPECTOMY    . CARDIAC CATHETERIZATION    . CHOLECYSTECTOMY    . INSERT / REPLACE / REMOVE PACEMAKER    . Stent to LAD  2011    Current Medications: Current Meds  Medication Sig  . acetaminophen (TYLENOL) 500 MG tablet Take 500 mg by mouth as needed.  Marland Kitchen amiodarone (PACERONE) 200 MG tablet Take 1 tablet (200 mg total) by mouth daily.  Marland Kitchen  ASPIRIN 81 PO Take by mouth daily.  Marland Kitchen levothyroxine (SYNTHROID, LEVOTHROID) 50 MCG tablet Take 50 mcg by mouth daily.  . Multiple Vitamin (MULTIVITAMIN) tablet Take 1 tablet by mouth daily.  . pantoprazole (PROTONIX) 40 MG tablet   . ranolazine (RANEXA) 500 MG 12 hr tablet Take 1 tablet by mouth twice daily  . sucralfate (CARAFATE) 1 g tablet TAKE 1 TABLET BY MOUTH TWICE DAILY ON AN EMPTY STOMACH     Allergies:   Lipitor [atorvastatin]   Social History   Socioeconomic History  . Marital status: Widowed    Spouse name: Not on file  . Number  of children: Not on file  . Years of education: Not on file  . Highest education level: Not on file  Occupational History  . Not on file  Tobacco Use  . Smoking status: Never Smoker  . Smokeless tobacco: Never Used  Substance and Sexual Activity  . Alcohol use: Never  . Drug use: Never  . Sexual activity: Not Currently  Other Topics Concern  . Not on file  Social History Narrative  . Not on file   Social Determinants of Health   Financial Resource Strain:   . Difficulty of Paying Living Expenses: Not on file  Food Insecurity:   . Worried About Charity fundraiser in the Last Year: Not on file  . Ran Out of Food in the Last Year: Not on file  Transportation Needs:   . Lack of Transportation (Medical): Not on file  . Lack of Transportation (Non-Medical): Not on file  Physical Activity:   . Days of Exercise per Week: Not on file  . Minutes of Exercise per Session: Not on file  Stress:   . Feeling of Stress : Not on file  Social Connections:   . Frequency of Communication with Friends and Family: Not on file  . Frequency of Social Gatherings with Friends and Family: Not on file  . Attends Religious Services: Not on file  . Active Member of Clubs or Organizations: Not on file  . Attends Archivist Meetings: Not on file  . Marital Status: Not on file     Family History: The patient's family history includes Cancer in her father; Heart attack in her father. ROS:   Please see the history of present illness.    All other systems reviewed and are negative.  EKGs/Labs/Other Studies Reviewed:    The following studies were reviewed today:  Pacemaker device checks 10/27/2018 showed normal thresholds and parameters device was programmed to maximize longevity.  An estimated generator life of 2.5 years Recent Labs: 02/26/2019: ALT 14; BUN 22; Creatinine, Ser 1.05; Potassium 4.8; Sodium 141; TSH 2.960  Recent Lipid Panel She has not had a lipid profile since 2018, at  that time cholesterol 245 LDL 161 HDL 44  Physical Exam:    VS:  BP 132/68 (BP Location: Left Arm, Patient Position: Sitting, Cuff Size: Normal)   Pulse 80   Ht 5\' 1"  (1.549 m)   Wt 123 lb (55.8 kg)   SpO2 91%   BMI 23.24 kg/m     Wt Readings from Last 3 Encounters:  03/26/19 123 lb (55.8 kg)  02/26/19 122 lb 9.6 oz (55.6 kg)  10/27/18 123 lb 12.8 oz (56.2 kg)     GEN:  Well nourished, well developed in no acute distress HEENT: Normal NECK: No JVD; No carotid bruits LYMPHATICS: No lymphadenopathy CARDIAC: RRR, no murmurs, rubs, gallops RESPIRATORY:  Clear to auscultation  without rales, wheezing or rhonchi  ABDOMEN: Soft, non-tender, non-distended MUSCULOSKELETAL:  No edema; No deformity  SKIN: Warm and dry NEUROLOGIC:  Alert and oriented x 3 PSYCHIATRIC:  Normal affect    Signed, Shirlee More, MD  03/26/2019 3:47 PM    [facility]

## 2019-03-26 ENCOUNTER — Other Ambulatory Visit: Payer: Self-pay

## 2019-03-26 ENCOUNTER — Encounter: Payer: Self-pay | Admitting: Cardiology

## 2019-03-26 ENCOUNTER — Ambulatory Visit (INDEPENDENT_AMBULATORY_CARE_PROVIDER_SITE_OTHER): Payer: PPO | Admitting: Cardiology

## 2019-03-26 VITALS — BP 132/68 | HR 80 | Ht 61.0 in | Wt 123.0 lb

## 2019-03-26 DIAGNOSIS — I4430 Unspecified atrioventricular block: Secondary | ICD-10-CM

## 2019-03-26 DIAGNOSIS — Z950 Presence of cardiac pacemaker: Secondary | ICD-10-CM | POA: Diagnosis not present

## 2019-03-26 DIAGNOSIS — I10 Essential (primary) hypertension: Secondary | ICD-10-CM

## 2019-03-26 DIAGNOSIS — I471 Supraventricular tachycardia: Secondary | ICD-10-CM | POA: Diagnosis not present

## 2019-03-26 DIAGNOSIS — I495 Sick sinus syndrome: Secondary | ICD-10-CM

## 2019-03-26 MED ORDER — ACEBUTOLOL HCL 200 MG PO CAPS: 200.0000 mg | ORAL_CAPSULE | Freq: Every day | ORAL | 3 refills | Status: DC

## 2019-03-26 NOTE — Patient Instructions (Signed)
Medication Instructions:  Start Acebutolol 200 mg daily at bedtime   *If you need a refill on your cardiac medications before your next appointment, please call your pharmacy*  Lab Work: None ordered   If you have labs (blood work) drawn today and your tests are completely normal, you will receive your results only by: Marland Kitchen MyChart Message (if you have MyChart) OR . A paper copy in the mail If you have any lab test that is abnormal or we need to change your treatment, we will call you to review the results.  Testing/Procedures: None ordered   Follow-Up: At [facility], you and your health needs are our priority.  As part of our continuing mission to provide you with exceptional heart care, we have created designated Provider Care Teams.  These Care Teams include your primary Cardiologist (physician) and Advanced Practice Providers (APPs -  Physician Assistants and Nurse Practitioners) who all work together to provide you with the care you need, when you need it.  Your next appointment:   6 month(s)  The format for your next appointment:   In Person  Provider:   Shirlee More, MD  Other Instructions None

## 2019-03-27 NOTE — Telephone Encounter (Signed)
Patient is scheduled for f/u with Dr. Curt Bears in [place] on 03/30/19. Will assist with monitor troubleshooting at that time.

## 2019-03-27 NOTE — Telephone Encounter (Signed)
We have been unable to get manual transmission from home.  [place], please call and schedule an appt with Dr Curt Bears the next time he is in [place]. Ask her to bring her monitor with her to appointment for troubleshooting.  Chanetta Marshall, NP 03/27/2019 8:55 AM

## 2019-03-30 ENCOUNTER — Ambulatory Visit (INDEPENDENT_AMBULATORY_CARE_PROVIDER_SITE_OTHER): Payer: PPO | Admitting: *Deleted

## 2019-03-30 ENCOUNTER — Encounter: Payer: Self-pay | Admitting: Cardiology

## 2019-03-30 ENCOUNTER — Other Ambulatory Visit: Payer: Self-pay

## 2019-03-30 ENCOUNTER — Ambulatory Visit (INDEPENDENT_AMBULATORY_CARE_PROVIDER_SITE_OTHER): Payer: PPO | Admitting: Cardiology

## 2019-03-30 VITALS — BP 156/75 | HR 78 | Ht 61.0 in | Wt 123.4 lb

## 2019-03-30 DIAGNOSIS — I442 Atrioventricular block, complete: Secondary | ICD-10-CM | POA: Diagnosis not present

## 2019-03-30 DIAGNOSIS — Z950 Presence of cardiac pacemaker: Secondary | ICD-10-CM

## 2019-03-30 DIAGNOSIS — I471 Supraventricular tachycardia: Secondary | ICD-10-CM | POA: Diagnosis not present

## 2019-03-30 LAB — CUP PACEART INCLINIC DEVICE CHECK
Battery Impedance: 2237 Ohm
Battery Remaining Longevity: 25 mo
Battery Voltage: 2.73 V
Brady Statistic AP VP Percent: 85 %
Brady Statistic AP VS Percent: 0 %
Brady Statistic AS VP Percent: 14 %
Brady Statistic AS VS Percent: 0 %
Date Time Interrogation Session: 20210201110610
Implantable Lead Implant Date: 20120906
Implantable Lead Implant Date: 20120906
Implantable Lead Location: 753859
Implantable Lead Location: 753860
Implantable Lead Model: 4076
Implantable Lead Model: 5076
Implantable Pulse Generator Implant Date: 20120906
Lead Channel Impedance Value: 498 Ohm
Lead Channel Impedance Value: 549 Ohm
Lead Channel Pacing Threshold Amplitude: 0.5 V
Lead Channel Pacing Threshold Amplitude: 0.75 V
Lead Channel Pacing Threshold Pulse Width: 0.4 ms
Lead Channel Pacing Threshold Pulse Width: 0.4 ms
Lead Channel Sensing Intrinsic Amplitude: 11.2 mV
Lead Channel Sensing Intrinsic Amplitude: 4 mV
Lead Channel Setting Pacing Amplitude: 1.5 V
Lead Channel Setting Pacing Amplitude: 2.5 V
Lead Channel Setting Pacing Pulse Width: 0.4 ms
Lead Channel Setting Sensing Sensitivity: 4 mV

## 2019-03-30 LAB — CUP PACEART REMOTE DEVICE CHECK
Battery Impedance: 2247 Ohm
Battery Remaining Longevity: 25 mo
Battery Voltage: 2.74 V
Brady Statistic AP VP Percent: 85 %
Brady Statistic AP VS Percent: 0 %
Brady Statistic AS VP Percent: 14 %
Brady Statistic AS VS Percent: 0 %
Date Time Interrogation Session: 20210201093037
Implantable Lead Implant Date: 20120906
Implantable Lead Implant Date: 20120906
Implantable Lead Location: 753859
Implantable Lead Location: 753860
Implantable Lead Model: 4076
Implantable Lead Model: 5076
Implantable Pulse Generator Implant Date: 20120906
Lead Channel Impedance Value: 497 Ohm
Lead Channel Impedance Value: 547 Ohm
Lead Channel Pacing Threshold Amplitude: 0.375 V
Lead Channel Pacing Threshold Amplitude: 0.75 V
Lead Channel Pacing Threshold Pulse Width: 0.4 ms
Lead Channel Pacing Threshold Pulse Width: 0.4 ms
Lead Channel Setting Pacing Amplitude: 1.5 V
Lead Channel Setting Pacing Amplitude: 2.5 V
Lead Channel Setting Pacing Pulse Width: 0.4 ms
Lead Channel Setting Sensing Sensitivity: 4 mV

## 2019-03-30 NOTE — Progress Notes (Signed)
Electrophysiology Office Note   Date:  03/30/2019   ID:  SHEALEE MANRRIQUEZ, Alferd Apa 01-04-1920, MRN [id number]  PCP:  Ronita Hipps, MD  Cardiologist:  Bettina Gavia Primary Electrophysiologist:  Shandon Burlingame Meredith Leeds, MD    No chief complaint on file.    History of Present Illness: ARIBEL CHAUSSEE is a 86 y.o. female who is being seen today for the evaluation of sick sinus syndrome at the request of Ronita Hipps, MD. Presenting today for electrophysiology evaluation.  She has a history of sick sinus syndrome status post Medtronic dual-chamber pacemaker, hypertension, hyperlipidemia, coronary artery disease.  Today, denies symptoms of palpitations, chest pain, shortness of breath, orthopnea, PND, lower extremity edema, claudication, dizziness, presyncope, syncope, bleeding, or neurologic sequela. The patient is tolerating medications without difficulties.    Past Medical History:  Diagnosis Date  . Coronary artery disease   . History of second degree heart block   . Hyperlipidemia   . Hypertension   . Hypothyroidism    Past Surgical History:  Procedure Laterality Date  . ABDOMINAL HYSTERECTOMY    . BREAST LUMPECTOMY    . CARDIAC CATHETERIZATION    . CHOLECYSTECTOMY    . INSERT / REPLACE / REMOVE PACEMAKER    . Stent to LAD  2011     Current Outpatient Medications  Medication Sig Dispense Refill  . acebutolol (SECTRAL) 200 MG capsule Take 1 capsule (200 mg total) by mouth at bedtime. 90 capsule 3  . acetaminophen (TYLENOL) 500 MG tablet Take 500 mg by mouth as needed.    . ASPIRIN 81 PO Take by mouth daily.    Marland Kitchen levothyroxine (SYNTHROID, LEVOTHROID) 50 MCG tablet Take 50 mcg by mouth daily.  3  . Multiple Vitamin (MULTIVITAMIN) tablet Take 1 tablet by mouth daily.    . nitroGLYCERIN (NITROSTAT) 0.4 MG SL tablet Place 1 tablet (0.4 mg total) under the tongue every 5 (five) minutes as needed for chest pain. 25 tablet 5  . pantoprazole (PROTONIX) 40 MG tablet     . ranolazine (RANEXA)  500 MG 12 hr tablet Take 500 mg by mouth every other day.    . sucralfate (CARAFATE) 1 g tablet TAKE 1 TABLET BY MOUTH TWICE DAILY ON AN EMPTY STOMACH     No current facility-administered medications for this visit.    Allergies:   Lipitor [atorvastatin]   Social History:  The patient  reports that she has never smoked. She has never used smokeless tobacco. She reports that she does not drink alcohol or use drugs.   Family History:  The patient's family history includes Cancer in her father; Heart attack in her father.    ROS:  Please see the history of present illness.   Otherwise, review of systems is positive for none.   All other systems are reviewed and negative.   PHYSICAL EXAM: VS:  BP (!) 156/75   Pulse 78   Ht 5\' 1"  (1.549 m)   Wt 123 lb 6.4 oz (56 kg)   SpO2 96%   BMI 23.32 kg/m  , BMI Body mass index is 23.32 kg/m. GEN: Well nourished, well developed, in no acute distress  HEENT: normal  Neck: no JVD, carotid bruits, or masses Cardiac: RRR; no murmurs, rubs, or gallops,no edema  Respiratory:  clear to auscultation bilaterally, normal work of breathing GI: soft, nontender, nondistended, + BS MS: no deformity or atrophy  Skin: warm and dry, device site well healed Neuro:  Strength and sensation are intact  Psych: euthymic mood, full affect  EKG:  EKG is not ordered today. Personal review of the ekg ordered 02/26/2019 shows AV paced  Personal review of the device interrogation today. Results in [place]: 02/26/2019: ALT 14; BUN 22; Creatinine, Ser 1.05; Potassium 4.8; Sodium 141; TSH 2.960    Lipid Panel  No results found for: CHOL, TRIG, HDL, CHOLHDL, VLDL, LDLCALC, LDLDIRECT   Wt Readings from Last 3 Encounters:  03/30/19 123 lb 6.4 oz (56 kg)  03/26/19 123 lb (55.8 kg)  02/26/19 122 lb 9.6 oz (55.6 kg)      Other studies Reviewed: Additional studies/ records that were reviewed today include: TTE 07/03/2017 Review of the above records  today demonstrates:  mild global LV systolic dysfunction No significant Doppler findings. No significant valvular abnormalities. Ejection fraction 40 to 45%   ASSESSMENT AND PLAN:  1.  Sick sinus syndrome: Status post Medtronic dual-chamber pacemaker.  Device functioning appropriately.  No changes.  2.  Coronary artery disease: No current chest pain.  Plan per primary cardiology.  3.  Hypertension: Mildly elevated today.  Has been better controlled in the past.  Due to her age, Keysi Oelkers make no changes.   Current medicines are reviewed at length with the patient today.   The patient does not have concerns regarding her medicines.  The following changes were made today: None  Labs/ tests ordered today include:  No orders of the defined types were placed in this encounter.    Disposition:   FU with Agam Davenport 1 year  Signed, Malita Ignasiak Meredith Leeds, MD  03/30/2019 9:47 AM     [facility] [address] [place] [address] [phone] (office) [phone] (fax)

## 2019-03-30 NOTE — Patient Instructions (Signed)
Medication Instructions:  Your physician recommends that you continue on your current medications as directed. Please refer to the Current Medication list given to you today.  *If you need a refill on your cardiac medications before your next appointment, please call your pharmacy*  Labwork: None ordered  Testing/Procedures: None ordered  Follow-Up: Remote monitoring is used to monitor your Pacemaker or ICD from home. This monitoring reduces the number of office visits required to check your device to one time per year. It allows [place] to keep an eye on the functioning of your device to ensure it is working properly. You are scheduled for a device check from home on 06/29/2019. You may send your transmission at any time that day. If you have a wireless device, the transmission will be sent automatically. After your physician reviews your transmission, you will receive a postcard with your next transmission date.  Your physician wants you to follow-up in: 1 year with Dr. Curt Bears. You will receive a reminder letter in the mail two months in advance. If you don't receive a letter, please call our office to schedule the follow-up appointment.   Thank you for choosing [facility]!!   Trinidad Curet, RN [phone]

## 2019-03-31 NOTE — Progress Notes (Signed)
PPM Remote  

## 2019-04-01 ENCOUNTER — Telehealth: Payer: Self-pay | Admitting: Cardiology

## 2019-04-01 MED ORDER — ACEBUTOLOL HCL 200 MG PO CAPS: 200.0000 mg | ORAL_CAPSULE | Freq: Every day | ORAL | 3 refills | Status: AC

## 2019-04-01 NOTE — Telephone Encounter (Signed)
I spoke with [facility] and they are unable to get 200 mg dose. They are not sure when they will get back in stock. I spoke with patient and [facility] [address] [facility] close to her and she is agreeable to using this pharmacy if they have medication.  [facility] and [facility]. Patient notified and she would like [facility] to [facility]. Will send to pharmacy

## 2019-04-01 NOTE — Telephone Encounter (Signed)
Pt c/o medication issue:  1. Name of Medication: acebutolol (SECTRAL) 200 MG capsule  2. How are you currently taking this medication (dosage and times per day)? Not currently taking  3. Are you having a reaction (difficulty breathing--STAT)? no  4. What is your medication issue? Patient states her pharmacy does not have this medication and are they are unsure when it will come in. She would like a different prescription for another medication since she is unable to take this one. She also says her friend is on a medication that she is interested in trying, but she does not know the name of it.

## 2019-04-02 DIAGNOSIS — Z950 Presence of cardiac pacemaker: Secondary | ICD-10-CM | POA: Diagnosis not present

## 2019-04-02 DIAGNOSIS — R002 Palpitations: Secondary | ICD-10-CM | POA: Diagnosis not present

## 2019-04-02 DIAGNOSIS — I10 Essential (primary) hypertension: Secondary | ICD-10-CM | POA: Diagnosis not present

## 2019-04-02 DIAGNOSIS — I2510 Atherosclerotic heart disease of native coronary artery without angina pectoris: Secondary | ICD-10-CM | POA: Diagnosis not present

## 2019-04-02 DIAGNOSIS — R0902 Hypoxemia: Secondary | ICD-10-CM | POA: Diagnosis not present

## 2019-04-08 DIAGNOSIS — H353132 Nonexudative age-related macular degeneration, bilateral, intermediate dry stage: Secondary | ICD-10-CM | POA: Diagnosis not present

## 2019-05-11 DIAGNOSIS — Z1331 Encounter for screening for depression: Secondary | ICD-10-CM | POA: Diagnosis not present

## 2019-05-11 DIAGNOSIS — Z6821 Body mass index (BMI) 21.0-21.9, adult: Secondary | ICD-10-CM | POA: Diagnosis not present

## 2019-05-11 DIAGNOSIS — J310 Chronic rhinitis: Secondary | ICD-10-CM | POA: Diagnosis not present

## 2019-06-29 ENCOUNTER — Ambulatory Visit (INDEPENDENT_AMBULATORY_CARE_PROVIDER_SITE_OTHER): Payer: PPO | Admitting: *Deleted

## 2019-06-29 DIAGNOSIS — I442 Atrioventricular block, complete: Secondary | ICD-10-CM

## 2019-06-29 LAB — CUP PACEART REMOTE DEVICE CHECK
Battery Impedance: 2357 Ohm
Battery Remaining Longevity: 23 mo
Battery Voltage: 2.72 V
Brady Statistic AP VP Percent: 98 %
Brady Statistic AP VS Percent: 0 %
Brady Statistic AS VP Percent: 2 %
Brady Statistic AS VS Percent: 0 %
Date Time Interrogation Session: 20210503090621
Implantable Lead Implant Date: 20120906
Implantable Lead Implant Date: 20120906
Implantable Lead Location: 753859
Implantable Lead Location: 753860
Implantable Lead Model: 4076
Implantable Lead Model: 5076
Implantable Pulse Generator Implant Date: 20120906
Lead Channel Impedance Value: 538 Ohm
Lead Channel Impedance Value: 547 Ohm
Lead Channel Pacing Threshold Amplitude: 0.375 V
Lead Channel Pacing Threshold Amplitude: 0.75 V
Lead Channel Pacing Threshold Pulse Width: 0.4 ms
Lead Channel Pacing Threshold Pulse Width: 0.4 ms
Lead Channel Setting Pacing Amplitude: 1.5 V
Lead Channel Setting Pacing Amplitude: 2.5 V
Lead Channel Setting Pacing Pulse Width: 0.4 ms
Lead Channel Setting Sensing Sensitivity: 4 mV

## 2019-06-30 NOTE — Progress Notes (Signed)
Remote pacemaker transmission.   

## 2019-07-09 DIAGNOSIS — L570 Actinic keratosis: Secondary | ICD-10-CM | POA: Diagnosis not present

## 2019-08-12 ENCOUNTER — Telehealth: Payer: Self-pay

## 2019-08-12 MED ORDER — NITROGLYCERIN 0.4 MG SL SUBL: 0.4000 mg | SUBLINGUAL_TABLET | SUBLINGUAL | 5 refills | Status: AC | PRN

## 2019-08-12 NOTE — Telephone Encounter (Signed)
Refill sent in per faxed request 

## 2019-08-13 ENCOUNTER — Other Ambulatory Visit: Payer: Self-pay | Admitting: Cardiology

## 2019-08-14 DIAGNOSIS — M25562 Pain in left knee: Secondary | ICD-10-CM | POA: Diagnosis not present

## 2019-08-14 DIAGNOSIS — Z6821 Body mass index (BMI) 21.0-21.9, adult: Secondary | ICD-10-CM | POA: Diagnosis not present

## 2019-08-14 DIAGNOSIS — H6120 Impacted cerumen, unspecified ear: Secondary | ICD-10-CM | POA: Diagnosis not present

## 2019-08-15 DIAGNOSIS — I25118 Atherosclerotic heart disease of native coronary artery with other forms of angina pectoris: Secondary | ICD-10-CM | POA: Diagnosis not present

## 2019-08-15 DIAGNOSIS — D72829 Elevated white blood cell count, unspecified: Secondary | ICD-10-CM | POA: Diagnosis not present

## 2019-08-15 DIAGNOSIS — Z950 Presence of cardiac pacemaker: Secondary | ICD-10-CM | POA: Diagnosis not present

## 2019-08-15 DIAGNOSIS — I110 Hypertensive heart disease with heart failure: Secondary | ICD-10-CM | POA: Diagnosis not present

## 2019-08-15 DIAGNOSIS — Z79899 Other long term (current) drug therapy: Secondary | ICD-10-CM | POA: Diagnosis not present

## 2019-08-15 DIAGNOSIS — R079 Chest pain, unspecified: Secondary | ICD-10-CM | POA: Diagnosis not present

## 2019-08-15 DIAGNOSIS — K224 Dyskinesia of esophagus: Secondary | ICD-10-CM | POA: Diagnosis not present

## 2019-08-15 DIAGNOSIS — Z9119 Patient's noncompliance with other medical treatment and regimen: Secondary | ICD-10-CM | POA: Diagnosis not present

## 2019-08-15 DIAGNOSIS — I517 Cardiomegaly: Secondary | ICD-10-CM | POA: Diagnosis not present

## 2019-08-15 DIAGNOSIS — R58 Hemorrhage, not elsewhere classified: Secondary | ICD-10-CM | POA: Diagnosis not present

## 2019-08-15 DIAGNOSIS — E039 Hypothyroidism, unspecified: Secondary | ICD-10-CM | POA: Diagnosis not present

## 2019-08-15 DIAGNOSIS — I10 Essential (primary) hypertension: Secondary | ICD-10-CM | POA: Diagnosis not present

## 2019-08-15 DIAGNOSIS — Z7982 Long term (current) use of aspirin: Secondary | ICD-10-CM | POA: Diagnosis not present

## 2019-08-15 DIAGNOSIS — R0789 Other chest pain: Secondary | ICD-10-CM | POA: Diagnosis not present

## 2019-08-15 DIAGNOSIS — I493 Ventricular premature depolarization: Secondary | ICD-10-CM | POA: Diagnosis not present

## 2019-08-15 DIAGNOSIS — I25119 Atherosclerotic heart disease of native coronary artery with unspecified angina pectoris: Secondary | ICD-10-CM | POA: Diagnosis not present

## 2019-08-15 DIAGNOSIS — R7989 Other specified abnormal findings of blood chemistry: Secondary | ICD-10-CM | POA: Diagnosis not present

## 2019-08-15 DIAGNOSIS — I2510 Atherosclerotic heart disease of native coronary artery without angina pectoris: Secondary | ICD-10-CM | POA: Diagnosis not present

## 2019-08-15 DIAGNOSIS — Z888 Allergy status to other drugs, medicaments and biological substances status: Secondary | ICD-10-CM | POA: Diagnosis not present

## 2019-08-15 DIAGNOSIS — K219 Gastro-esophageal reflux disease without esophagitis: Secondary | ICD-10-CM | POA: Diagnosis not present

## 2019-08-15 DIAGNOSIS — I083 Combined rheumatic disorders of mitral, aortic and tricuspid valves: Secondary | ICD-10-CM | POA: Diagnosis not present

## 2019-08-15 DIAGNOSIS — I5022 Chronic systolic (congestive) heart failure: Secondary | ICD-10-CM | POA: Diagnosis not present

## 2019-08-15 DIAGNOSIS — I213 ST elevation (STEMI) myocardial infarction of unspecified site: Secondary | ICD-10-CM | POA: Diagnosis not present

## 2019-08-15 DIAGNOSIS — Z8249 Family history of ischemic heart disease and other diseases of the circulatory system: Secondary | ICD-10-CM | POA: Diagnosis not present

## 2019-08-15 DIAGNOSIS — I480 Paroxysmal atrial fibrillation: Secondary | ICD-10-CM | POA: Diagnosis not present

## 2019-08-15 DIAGNOSIS — Z90710 Acquired absence of both cervix and uterus: Secondary | ICD-10-CM | POA: Diagnosis not present

## 2019-08-15 DIAGNOSIS — I4891 Unspecified atrial fibrillation: Secondary | ICD-10-CM | POA: Diagnosis not present

## 2019-08-15 DIAGNOSIS — R778 Other specified abnormalities of plasma proteins: Secondary | ICD-10-CM | POA: Diagnosis not present

## 2019-08-15 DIAGNOSIS — I498 Other specified cardiac arrhythmias: Secondary | ICD-10-CM | POA: Diagnosis not present

## 2019-08-15 DIAGNOSIS — I5020 Unspecified systolic (congestive) heart failure: Secondary | ICD-10-CM | POA: Diagnosis not present

## 2019-08-15 DIAGNOSIS — Z79890 Hormone replacement therapy (postmenopausal): Secondary | ICD-10-CM | POA: Diagnosis not present

## 2019-08-15 DIAGNOSIS — Z809 Family history of malignant neoplasm, unspecified: Secondary | ICD-10-CM | POA: Diagnosis not present

## 2019-08-15 DIAGNOSIS — Z66 Do not resuscitate: Secondary | ICD-10-CM | POA: Diagnosis not present

## 2019-08-15 DIAGNOSIS — I252 Old myocardial infarction: Secondary | ICD-10-CM | POA: Diagnosis not present

## 2019-08-15 DIAGNOSIS — Z955 Presence of coronary angioplasty implant and graft: Secondary | ICD-10-CM | POA: Diagnosis not present

## 2019-08-15 DIAGNOSIS — E785 Hyperlipidemia, unspecified: Secondary | ICD-10-CM | POA: Diagnosis not present

## 2019-09-07 DIAGNOSIS — Z6821 Body mass index (BMI) 21.0-21.9, adult: Secondary | ICD-10-CM | POA: Diagnosis not present

## 2019-09-07 DIAGNOSIS — I10 Essential (primary) hypertension: Secondary | ICD-10-CM | POA: Diagnosis not present

## 2019-09-24 ENCOUNTER — Other Ambulatory Visit: Payer: Self-pay

## 2019-09-28 ENCOUNTER — Ambulatory Visit (INDEPENDENT_AMBULATORY_CARE_PROVIDER_SITE_OTHER): Payer: PPO | Admitting: *Deleted

## 2019-09-28 DIAGNOSIS — I4430 Unspecified atrioventricular block: Secondary | ICD-10-CM | POA: Diagnosis not present

## 2019-09-29 LAB — CUP PACEART REMOTE DEVICE CHECK
Battery Impedance: 2445 Ohm
Battery Remaining Longevity: 23 mo
Battery Voltage: 2.72 V
Brady Statistic AP VP Percent: 95 %
Brady Statistic AP VS Percent: 0 %
Brady Statistic AS VP Percent: 5 %
Brady Statistic AS VS Percent: 0 %
Date Time Interrogation Session: 20210803091655
Implantable Lead Implant Date: 20120906
Implantable Lead Implant Date: 20120906
Implantable Lead Location: 753859
Implantable Lead Location: 753860
Implantable Lead Model: 4076
Implantable Lead Model: 5076
Implantable Pulse Generator Implant Date: 20120906
Lead Channel Impedance Value: 519 Ohm
Lead Channel Impedance Value: 578 Ohm
Lead Channel Pacing Threshold Amplitude: 0.375 V
Lead Channel Pacing Threshold Amplitude: 0.625 V
Lead Channel Pacing Threshold Pulse Width: 0.4 ms
Lead Channel Pacing Threshold Pulse Width: 0.4 ms
Lead Channel Setting Pacing Amplitude: 1.5 V
Lead Channel Setting Pacing Amplitude: 2.5 V
Lead Channel Setting Pacing Pulse Width: 0.4 ms
Lead Channel Setting Sensing Sensitivity: 4 mV

## 2019-10-01 NOTE — Progress Notes (Signed)
Remote pacemaker transmission.   

## 2019-10-08 ENCOUNTER — Other Ambulatory Visit: Payer: Self-pay | Admitting: Cardiology

## 2019-10-08 NOTE — Telephone Encounter (Signed)
Rx refill sent to pharmacy. 

## 2019-10-27 NOTE — Progress Notes (Signed)
Cardiology Office Note:    Date:  10/28/2019   ID:  Maria Schmidt, DOB 05-16-1919, MRN [id number]  PCP:  Ronita Hipps, MD  Cardiologist:  Shirlee More, MD    Referring MD: Ronita Hipps, MD    ASSESSMENT:    1. Coronary artery disease of native artery of native heart with stable angina pectoris ([place])   2. Pacemaker   3. Essential hypertension   4. Dyslipidemia    PLAN:    In order of problems listed above:  1. She has limited options in my opinion she had troponin normal unstable angina pectoris I stressed to the importance of compliance with her medicines were again add oral nitrates I think she is a particularly poor candidate for coronary interventions with age frailty and previous GI bleed.  She is somewhat fatalistic today and tells me she is not going back to the hospital.  I will think there is any role for stress testing.  Current regimen including beta-blocker aspirin calcium channel blocker ranolazine Adderall oral nitrates. 2. Stable BP controlled continue current treatment 3. Poorly controlled statin intolerant and I would not advise PCSK9 therapy   Next appointment: 6 months   Medication Adjustments/Labs and Tests Ordered: Current medicines are reviewed at length with the patient today.  Concerns regarding medicines are outlined above.  No orders of the defined types were placed in this encounter.  Meds ordered this encounter  Medications  . isosorbide mononitrate (IMDUR) 30 MG 24 hr tablet    Sig: Take 1 tablet (30 mg total) by mouth daily.    Dispense:  90 tablet    Refill:  3    Chief Complaint  Patient presents with  . Follow-up    She was admitted to [facility] with chest pain    History of Present Illness:    Maria Schmidt is a 86 y.o. female with a hx of  CAD bare-metal stent to left anterior descending 2011 EF 70% dyslipidemia hypertension and sick sinus syndrome with a Medtronic pacemaker  and paroxysmal atrial tachycardia and atrial  fibrillation as well as previous GI bleed and is not anticoagulated last seen 03/26/2019.  She has been intolerant of multiple statins.  Compliance with diet, lifestyle and medications: No, she told me that prior to hospitalization she had stopped one of her medications and thinks it contributed to her admission to the hospital.  She had an episode last night awakening from sleep with chest pain relieved with nitroglycerin.  She tells me she is not going back to the hospital again.  No edema orthopnea shortness of breath potation or syncope  I reviewed these records during the patient visit.  She was admitted to [facility] 08/15/2019 discharge 08/17/2019 with chest pain did not undergo heart catheterization and treated medically for angina. Echocardiogram show EF of 50 to 55% mild left atrial enlargement mild aortic mitral and tricuspid regurgitation. EKG showed AV paced rhythm occasional PVCs.  Her last pacemaker check 09/29/2019 shows A. fib with less than 1% burden longest episode 8 minutes and normal device battery lead parameters and function.  She is 100% RV paced.  Her high-sensitivity troponin was elevated but there is no delta and the findings were not specific for acute coronary syndrome.  The 3 numbers 0998 and 60.  It was not elevated for age.  CBC showed a hemoglobin of 12.9 platelets normal 309,000 and creatinine normal 0.86 potassium 4.6.  Chest x-ray no acute changes or  cardiopulmonary disease.  Past Medical History:  Diagnosis Date  . Coronary artery disease   . History of second degree heart block   . Hyperlipidemia   . Hypertension   . Hypothyroidism     Past Surgical History:  Procedure Laterality Date  . ABDOMINAL HYSTERECTOMY    . BREAST LUMPECTOMY    . CARDIAC CATHETERIZATION    . CHOLECYSTECTOMY    . INSERT / REPLACE / REMOVE PACEMAKER    . Stent to LAD  2011    Current Medications: Current Meds  Medication Sig  . acebutolol (SECTRAL) 200 MG capsule Take 1  capsule (200 mg total) by mouth at bedtime.  Marland Kitchen acetaminophen (TYLENOL) 500 MG tablet Take 500 mg by mouth as needed.  . ASPIRIN 81 PO Take by mouth daily.  Marland Kitchen levothyroxine (SYNTHROID, LEVOTHROID) 50 MCG tablet Take 50 mcg by mouth daily.  . Multiple Vitamin (MULTIVITAMIN) tablet Take 1 tablet by mouth daily.  Marland Kitchen NIFEdipine (ADALAT CC) 30 MG 24 hr tablet Take 30 mg by mouth daily.  . nitroGLYCERIN (NITROSTAT) 0.4 MG SL tablet Place 1 tablet (0.4 mg total) under the tongue every 5 (five) minutes as needed for chest pain.  . pantoprazole (PROTONIX) 40 MG tablet   . ranolazine (RANEXA) 500 MG 12 hr tablet Take 1 tablet (500 mg total) by mouth every other day.     Allergies:   Lipitor [atorvastatin], Benazepril hcl, Cholestyramine, Cimetidine, Gabapentin, Lovastatin, Pravastatin sodium, Simvastatin, Sucrose, and Telmisartan   Social History   Socioeconomic History  . Marital status: Widowed    Spouse name: Not on file  . Number of children: Not on file  . Years of education: Not on file  . Highest education level: Not on file  Occupational History  . Not on file  Tobacco Use  . Smoking status: Never Smoker  . Smokeless tobacco: Never Used  Vaping Use  . Vaping Use: Never used  Substance and Sexual Activity  . Alcohol use: Never  . Drug use: Never  . Sexual activity: Not Currently  Other Topics Concern  . Not on file  Social History Narrative  . Not on file   Social Determinants of Health   Financial Resource Strain:   . Difficulty of Paying Living Expenses: Not on file  Food Insecurity:   . Worried About Charity fundraiser in the Last Year: Not on file  . Ran Out of Food in the Last Year: Not on file  Transportation Needs:   . Lack of Transportation (Medical): Not on file  . Lack of Transportation (Non-Medical): Not on file  Physical Activity:   . Days of Exercise per Week: Not on file  . Minutes of Exercise per Session: Not on file  Stress:   . Feeling of Stress : Not  on file  Social Connections:   . Frequency of Communication with Friends and Family: Not on file  . Frequency of Social Gatherings with Friends and Family: Not on file  . Attends Religious Services: Not on file  . Active Member of Clubs or Organizations: Not on file  . Attends Archivist Meetings: Not on file  . Marital Status: Not on file     Family History: The patient's family history includes Cancer in her father; Heart attack in her father. ROS:   Please see the history of present illness.    All other systems reviewed and are negative.  EKGs/Labs/Other Studies Reviewed:    The following studies were reviewed  today:    Recent Labs: 02/26/2019: ALT 14; BUN 22; Creatinine, Ser 1.05; Potassium 4.8; Sodium 141; TSH 2.960  Recent Lipid Panel No results found for: CHOL, TRIG, HDL, CHOLHDL, VLDL, LDLCALC, LDLDIRECT  Physical Exam:    VS:  BP (!) 102/50   Pulse 72   Ht 5\' 1"  (1.549 m)   Wt 120 lb 9.6 oz (54.7 kg)   SpO2 93%   BMI 22.79 kg/m     Wt Readings from Last 3 Encounters:  10/28/19 120 lb 9.6 oz (54.7 kg)  03/30/19 123 lb 6.4 oz (56 kg)  03/26/19 123 lb (55.8 kg)     GEN: She looks frail in no acute distress HEENT: Normal NECK: No JVD; No carotid bruits LYMPHATICS: No lymphadenopathy CARDIAC: RRR, no murmurs, rubs, gallops RESPIRATORY:  Clear to auscultation without rales, wheezing or rhonchi  ABDOMEN: Soft, non-tender, non-distended MUSCULOSKELETAL:  No edema; No deformity  SKIN: Warm and dry NEUROLOGIC:  Alert and oriented x 3 PSYCHIATRIC:  Normal affect    Signed, Shirlee More, MD  10/28/2019 11:50 AM    [place]

## 2019-10-28 ENCOUNTER — Other Ambulatory Visit: Payer: Self-pay

## 2019-10-28 ENCOUNTER — Encounter: Payer: Self-pay | Admitting: Cardiology

## 2019-10-28 ENCOUNTER — Ambulatory Visit: Payer: PPO | Admitting: Cardiology

## 2019-10-28 VITALS — BP 102/50 | HR 72 | Ht 61.0 in | Wt 120.6 lb

## 2019-10-28 DIAGNOSIS — Z950 Presence of cardiac pacemaker: Secondary | ICD-10-CM

## 2019-10-28 DIAGNOSIS — I25118 Atherosclerotic heart disease of native coronary artery with other forms of angina pectoris: Secondary | ICD-10-CM | POA: Diagnosis not present

## 2019-10-28 DIAGNOSIS — E785 Hyperlipidemia, unspecified: Secondary | ICD-10-CM

## 2019-10-28 DIAGNOSIS — I10 Essential (primary) hypertension: Secondary | ICD-10-CM | POA: Diagnosis not present

## 2019-10-28 MED ORDER — ISOSORBIDE MONONITRATE ER 30 MG PO TB24: 30.0000 mg | ORAL_TABLET | Freq: Every day | ORAL | 3 refills | Status: AC

## 2019-10-28 NOTE — Patient Instructions (Signed)
Medication Instructions:  Your physician has recommended you make the following change in your medication:  START: Imdur 30 mg take one tablet by mouth daily.  *If you need a refill on your cardiac medications before your next appointment, please call your pharmacy*   Lab Work: None If you have labs (blood work) drawn today and your tests are completely normal, you will receive your results only by: . MyChart Message (if you have MyChart) OR . A paper copy in the mail If you have any lab test that is abnormal or we need to change your treatment, we will call you to review the results.   Testing/Procedures: None   Follow-Up: At [facility], you and your health needs are our priority.  As part of our continuing mission to provide you with exceptional heart care, we have created designated Provider Care Teams.  These Care Teams include your primary Cardiologist (physician) and Advanced Practice Providers (APPs -  Physician Assistants and Nurse Practitioners) who all work together to provide you with the care you need, when you need it.  We recommend signing up for the patient portal called "MyChart".  Sign up information is provided on this After Visit Summary.  MyChart is used to connect with patients for Virtual Visits (Telemedicine).  Patients are able to view lab/test results, encounter notes, upcoming appointments, etc.  Non-urgent messages can be sent to your provider as well.   To learn more about what you can do with MyChart, go to [email or web address].    Your next appointment:   6 month(s)  The format for your next appointment:   In Person  Provider:   Brian Munley, MD   Other Instructions   

## 2019-12-28 ENCOUNTER — Ambulatory Visit (INDEPENDENT_AMBULATORY_CARE_PROVIDER_SITE_OTHER): Payer: PPO

## 2019-12-28 DIAGNOSIS — I4430 Unspecified atrioventricular block: Secondary | ICD-10-CM | POA: Diagnosis not present

## 2019-12-29 LAB — CUP PACEART REMOTE DEVICE CHECK
Battery Impedance: 2957 Ohm
Battery Remaining Longevity: 18 mo
Battery Voltage: 2.71 V
Brady Statistic AP VP Percent: 93 %
Brady Statistic AP VS Percent: 0 %
Brady Statistic AS VP Percent: 7 %
Brady Statistic AS VS Percent: 0 %
Date Time Interrogation Session: 20211101141458
Implantable Lead Implant Date: 20120906
Implantable Lead Implant Date: 20120906
Implantable Lead Location: 753859
Implantable Lead Location: 753860
Implantable Lead Model: 4076
Implantable Lead Model: 5076
Implantable Pulse Generator Implant Date: 20120906
Lead Channel Impedance Value: 509 Ohm
Lead Channel Impedance Value: 529 Ohm
Lead Channel Pacing Threshold Amplitude: 0.375 V
Lead Channel Pacing Threshold Amplitude: 0.75 V
Lead Channel Pacing Threshold Pulse Width: 0.4 ms
Lead Channel Pacing Threshold Pulse Width: 0.4 ms
Lead Channel Setting Pacing Amplitude: 1.5 V
Lead Channel Setting Pacing Amplitude: 2.5 V
Lead Channel Setting Pacing Pulse Width: 0.4 ms
Lead Channel Setting Sensing Sensitivity: 4 mV

## 2019-12-30 NOTE — Progress Notes (Signed)
Remote pacemaker transmission.   

## 2020-02-01 ENCOUNTER — Other Ambulatory Visit: Payer: Self-pay | Admitting: Cardiology

## 2020-02-01 DIAGNOSIS — I4430 Unspecified atrioventricular block: Secondary | ICD-10-CM

## 2020-03-22 DIAGNOSIS — M1712 Unilateral primary osteoarthritis, left knee: Secondary | ICD-10-CM | POA: Diagnosis not present

## 2020-03-25 DIAGNOSIS — Y998 Other external cause status: Secondary | ICD-10-CM | POA: Diagnosis not present

## 2020-03-25 DIAGNOSIS — I493 Ventricular premature depolarization: Secondary | ICD-10-CM | POA: Diagnosis not present

## 2020-03-25 DIAGNOSIS — S42352A Displaced comminuted fracture of shaft of humerus, left arm, initial encounter for closed fracture: Secondary | ICD-10-CM | POA: Diagnosis not present

## 2020-03-25 DIAGNOSIS — K449 Diaphragmatic hernia without obstruction or gangrene: Secondary | ICD-10-CM | POA: Diagnosis not present

## 2020-03-25 DIAGNOSIS — I10 Essential (primary) hypertension: Secondary | ICD-10-CM | POA: Diagnosis not present

## 2020-03-25 DIAGNOSIS — R9431 Abnormal electrocardiogram [ECG] [EKG]: Secondary | ICD-10-CM | POA: Diagnosis not present

## 2020-03-25 DIAGNOSIS — Z043 Encounter for examination and observation following other accident: Secondary | ICD-10-CM | POA: Diagnosis not present

## 2020-03-25 DIAGNOSIS — M25552 Pain in left hip: Secondary | ICD-10-CM | POA: Diagnosis not present

## 2020-03-25 DIAGNOSIS — S42202A Unspecified fracture of upper end of left humerus, initial encounter for closed fracture: Secondary | ICD-10-CM | POA: Diagnosis not present

## 2020-03-25 DIAGNOSIS — S0990XA Unspecified injury of head, initial encounter: Secondary | ICD-10-CM | POA: Diagnosis not present

## 2020-03-25 DIAGNOSIS — S065X0A Traumatic subdural hemorrhage without loss of consciousness, initial encounter: Secondary | ICD-10-CM | POA: Diagnosis not present

## 2020-03-25 DIAGNOSIS — S728X2A Other fracture of left femur, initial encounter for closed fracture: Secondary | ICD-10-CM | POA: Diagnosis not present

## 2020-03-25 DIAGNOSIS — S32512A Fracture of superior rim of left pubis, initial encounter for closed fracture: Secondary | ICD-10-CM | POA: Diagnosis not present

## 2020-03-25 DIAGNOSIS — I2510 Atherosclerotic heart disease of native coronary artery without angina pectoris: Secondary | ICD-10-CM | POA: Diagnosis not present

## 2020-03-25 DIAGNOSIS — R58 Hemorrhage, not elsewhere classified: Secondary | ICD-10-CM | POA: Diagnosis not present

## 2020-03-25 DIAGNOSIS — Z20822 Contact with and (suspected) exposure to covid-19: Secondary | ICD-10-CM | POA: Diagnosis not present

## 2020-03-25 DIAGNOSIS — R001 Bradycardia, unspecified: Secondary | ICD-10-CM | POA: Diagnosis not present

## 2020-03-25 DIAGNOSIS — W1789XA Other fall from one level to another, initial encounter: Secondary | ICD-10-CM | POA: Diagnosis not present

## 2020-03-25 DIAGNOSIS — S42212A Unspecified displaced fracture of surgical neck of left humerus, initial encounter for closed fracture: Secondary | ICD-10-CM | POA: Diagnosis not present

## 2020-03-25 DIAGNOSIS — M25522 Pain in left elbow: Secondary | ICD-10-CM | POA: Diagnosis not present

## 2020-03-25 DIAGNOSIS — I700 Atherosclerosis of aorta: Secondary | ICD-10-CM | POA: Diagnosis not present

## 2020-03-25 DIAGNOSIS — R52 Pain, unspecified: Secondary | ICD-10-CM | POA: Diagnosis not present

## 2020-03-25 DIAGNOSIS — S2242XA Multiple fractures of ribs, left side, initial encounter for closed fracture: Secondary | ICD-10-CM | POA: Diagnosis not present

## 2020-03-25 DIAGNOSIS — W1839XA Other fall on same level, initial encounter: Secondary | ICD-10-CM | POA: Diagnosis not present

## 2020-03-25 DIAGNOSIS — S065X9A Traumatic subdural hemorrhage with loss of consciousness of unspecified duration, initial encounter: Secondary | ICD-10-CM | POA: Diagnosis not present

## 2020-03-25 DIAGNOSIS — S06360A Traumatic hemorrhage of cerebrum, unspecified, without loss of consciousness, initial encounter: Secondary | ICD-10-CM | POA: Diagnosis not present

## 2020-03-25 DIAGNOSIS — M7989 Other specified soft tissue disorders: Secondary | ICD-10-CM | POA: Diagnosis not present

## 2020-03-25 DIAGNOSIS — M25462 Effusion, left knee: Secondary | ICD-10-CM | POA: Diagnosis not present

## 2020-03-26 DIAGNOSIS — I6782 Cerebral ischemia: Secondary | ICD-10-CM | POA: Diagnosis not present

## 2020-03-26 DIAGNOSIS — M4319 Spondylolisthesis, multiple sites in spine: Secondary | ICD-10-CM | POA: Diagnosis not present

## 2020-03-26 DIAGNOSIS — S42302A Unspecified fracture of shaft of humerus, left arm, initial encounter for closed fracture: Secondary | ICD-10-CM | POA: Diagnosis not present

## 2020-03-26 DIAGNOSIS — D72829 Elevated white blood cell count, unspecified: Secondary | ICD-10-CM | POA: Diagnosis not present

## 2020-03-26 DIAGNOSIS — Z950 Presence of cardiac pacemaker: Secondary | ICD-10-CM | POA: Diagnosis not present

## 2020-03-26 DIAGNOSIS — S065X9A Traumatic subdural hemorrhage with loss of consciousness of unspecified duration, initial encounter: Secondary | ICD-10-CM | POA: Diagnosis not present

## 2020-03-26 DIAGNOSIS — E039 Hypothyroidism, unspecified: Secondary | ICD-10-CM | POA: Diagnosis not present

## 2020-03-26 DIAGNOSIS — S2242XA Multiple fractures of ribs, left side, initial encounter for closed fracture: Secondary | ICD-10-CM | POA: Diagnosis not present

## 2020-03-26 DIAGNOSIS — W19XXXA Unspecified fall, initial encounter: Secondary | ICD-10-CM | POA: Diagnosis not present

## 2020-03-26 DIAGNOSIS — I6201 Nontraumatic acute subdural hemorrhage: Secondary | ICD-10-CM | POA: Diagnosis not present

## 2020-03-26 DIAGNOSIS — S32512A Fracture of superior rim of left pubis, initial encounter for closed fracture: Secondary | ICD-10-CM | POA: Diagnosis not present

## 2020-03-26 DIAGNOSIS — E785 Hyperlipidemia, unspecified: Secondary | ICD-10-CM | POA: Diagnosis not present

## 2020-03-26 DIAGNOSIS — I493 Ventricular premature depolarization: Secondary | ICD-10-CM | POA: Diagnosis not present

## 2020-03-26 DIAGNOSIS — S42212A Unspecified displaced fracture of surgical neck of left humerus, initial encounter for closed fracture: Secondary | ICD-10-CM | POA: Diagnosis not present

## 2020-03-26 DIAGNOSIS — Y998 Other external cause status: Secondary | ICD-10-CM | POA: Diagnosis not present

## 2020-03-26 DIAGNOSIS — S42202A Unspecified fracture of upper end of left humerus, initial encounter for closed fracture: Secondary | ICD-10-CM | POA: Diagnosis not present

## 2020-03-26 DIAGNOSIS — I2510 Atherosclerotic heart disease of native coronary artery without angina pectoris: Secondary | ICD-10-CM | POA: Diagnosis not present

## 2020-03-26 DIAGNOSIS — S065X0A Traumatic subdural hemorrhage without loss of consciousness, initial encounter: Secondary | ICD-10-CM | POA: Diagnosis not present

## 2020-03-26 DIAGNOSIS — S42352A Displaced comminuted fracture of shaft of humerus, left arm, initial encounter for closed fracture: Secondary | ICD-10-CM | POA: Diagnosis not present

## 2020-03-26 DIAGNOSIS — I447 Left bundle-branch block, unspecified: Secondary | ICD-10-CM | POA: Diagnosis not present

## 2020-03-26 DIAGNOSIS — K5730 Diverticulosis of large intestine without perforation or abscess without bleeding: Secondary | ICD-10-CM | POA: Diagnosis not present

## 2020-03-26 DIAGNOSIS — R748 Abnormal levels of other serum enzymes: Secondary | ICD-10-CM | POA: Diagnosis not present

## 2020-03-26 DIAGNOSIS — Z049 Encounter for examination and observation for unspecified reason: Secondary | ICD-10-CM | POA: Diagnosis not present

## 2020-03-26 DIAGNOSIS — K219 Gastro-esophageal reflux disease without esophagitis: Secondary | ICD-10-CM | POA: Diagnosis not present

## 2020-03-26 DIAGNOSIS — M48061 Spinal stenosis, lumbar region without neurogenic claudication: Secondary | ICD-10-CM | POA: Diagnosis not present

## 2020-03-26 DIAGNOSIS — N281 Cyst of kidney, acquired: Secondary | ICD-10-CM | POA: Diagnosis not present

## 2020-03-26 DIAGNOSIS — S32592A Other specified fracture of left pubis, initial encounter for closed fracture: Secondary | ICD-10-CM | POA: Diagnosis not present

## 2020-03-26 DIAGNOSIS — M47812 Spondylosis without myelopathy or radiculopathy, cervical region: Secondary | ICD-10-CM | POA: Diagnosis not present

## 2020-03-26 DIAGNOSIS — Z7409 Other reduced mobility: Secondary | ICD-10-CM | POA: Diagnosis not present

## 2020-03-26 DIAGNOSIS — M5136 Other intervertebral disc degeneration, lumbar region: Secondary | ICD-10-CM | POA: Diagnosis not present

## 2020-03-26 DIAGNOSIS — K449 Diaphragmatic hernia without obstruction or gangrene: Secondary | ICD-10-CM | POA: Diagnosis not present

## 2020-03-26 DIAGNOSIS — I6523 Occlusion and stenosis of bilateral carotid arteries: Secondary | ICD-10-CM | POA: Diagnosis not present

## 2020-03-26 DIAGNOSIS — I110 Hypertensive heart disease with heart failure: Secondary | ICD-10-CM | POA: Diagnosis not present

## 2020-03-26 DIAGNOSIS — Z20822 Contact with and (suspected) exposure to covid-19: Secondary | ICD-10-CM | POA: Diagnosis not present

## 2020-03-26 DIAGNOSIS — I7789 Other specified disorders of arteries and arterioles: Secondary | ICD-10-CM | POA: Diagnosis not present

## 2020-03-26 DIAGNOSIS — W1839XA Other fall on same level, initial encounter: Secondary | ICD-10-CM | POA: Diagnosis not present

## 2020-03-26 DIAGNOSIS — I714 Abdominal aortic aneurysm, without rupture: Secondary | ICD-10-CM | POA: Diagnosis not present

## 2020-03-26 DIAGNOSIS — G9389 Other specified disorders of brain: Secondary | ICD-10-CM | POA: Diagnosis not present

## 2020-03-27 DIAGNOSIS — S065X9A Traumatic subdural hemorrhage with loss of consciousness of unspecified duration, initial encounter: Secondary | ICD-10-CM | POA: Diagnosis not present

## 2020-03-27 DIAGNOSIS — S42202A Unspecified fracture of upper end of left humerus, initial encounter for closed fracture: Secondary | ICD-10-CM | POA: Diagnosis not present

## 2020-03-27 DIAGNOSIS — S32512A Fracture of superior rim of left pubis, initial encounter for closed fracture: Secondary | ICD-10-CM | POA: Diagnosis not present

## 2020-03-27 DIAGNOSIS — S066X9A Traumatic subarachnoid hemorrhage with loss of consciousness of unspecified duration, initial encounter: Secondary | ICD-10-CM | POA: Diagnosis not present

## 2020-03-28 DIAGNOSIS — S42302S Unspecified fracture of shaft of humerus, left arm, sequela: Secondary | ICD-10-CM | POA: Diagnosis not present

## 2020-03-28 DIAGNOSIS — S32512A Fracture of superior rim of left pubis, initial encounter for closed fracture: Secondary | ICD-10-CM | POA: Diagnosis not present

## 2020-03-28 DIAGNOSIS — Y939 Activity, unspecified: Secondary | ICD-10-CM | POA: Diagnosis not present

## 2020-03-28 DIAGNOSIS — G4700 Insomnia, unspecified: Secondary | ICD-10-CM | POA: Diagnosis not present

## 2020-03-28 DIAGNOSIS — I6200 Nontraumatic subdural hemorrhage, unspecified: Secondary | ICD-10-CM | POA: Diagnosis not present

## 2020-03-28 DIAGNOSIS — S065X9A Traumatic subdural hemorrhage with loss of consciousness of unspecified duration, initial encounter: Secondary | ICD-10-CM | POA: Diagnosis not present

## 2020-03-28 DIAGNOSIS — Y92002 Bathroom of unspecified non-institutional (private) residence single-family (private) house as the place of occurrence of the external cause: Secondary | ICD-10-CM | POA: Diagnosis not present

## 2020-03-28 DIAGNOSIS — W1830XA Fall on same level, unspecified, initial encounter: Secondary | ICD-10-CM | POA: Diagnosis not present

## 2020-03-28 DIAGNOSIS — S42202A Unspecified fracture of upper end of left humerus, initial encounter for closed fracture: Secondary | ICD-10-CM | POA: Diagnosis not present

## 2020-03-28 DIAGNOSIS — S32502S Unspecified fracture of left pubis, sequela: Secondary | ICD-10-CM | POA: Diagnosis not present

## 2020-03-28 DIAGNOSIS — S2242XS Multiple fractures of ribs, left side, sequela: Secondary | ICD-10-CM | POA: Diagnosis not present

## 2020-03-29 DIAGNOSIS — I495 Sick sinus syndrome: Secondary | ICD-10-CM | POA: Diagnosis not present

## 2020-03-29 DIAGNOSIS — Z950 Presence of cardiac pacemaker: Secondary | ICD-10-CM | POA: Diagnosis not present

## 2020-03-29 DIAGNOSIS — K219 Gastro-esophageal reflux disease without esophagitis: Secondary | ICD-10-CM | POA: Diagnosis not present

## 2020-03-29 DIAGNOSIS — S2242XA Multiple fractures of ribs, left side, initial encounter for closed fracture: Secondary | ICD-10-CM | POA: Diagnosis not present

## 2020-03-29 DIAGNOSIS — M25532 Pain in left wrist: Secondary | ICD-10-CM | POA: Diagnosis not present

## 2020-03-29 DIAGNOSIS — D62 Acute posthemorrhagic anemia: Secondary | ICD-10-CM | POA: Diagnosis not present

## 2020-03-29 DIAGNOSIS — G4700 Insomnia, unspecified: Secondary | ICD-10-CM | POA: Diagnosis not present

## 2020-03-30 DIAGNOSIS — R2681 Unsteadiness on feet: Secondary | ICD-10-CM | POA: Diagnosis not present

## 2020-03-30 DIAGNOSIS — I708 Atherosclerosis of other arteries: Secondary | ICD-10-CM | POA: Diagnosis not present

## 2020-03-30 DIAGNOSIS — Z87891 Personal history of nicotine dependence: Secondary | ICD-10-CM | POA: Diagnosis not present

## 2020-03-30 DIAGNOSIS — S42325D Nondisplaced transverse fracture of shaft of humerus, left arm, subsequent encounter for fracture with routine healing: Secondary | ICD-10-CM | POA: Diagnosis not present

## 2020-03-30 DIAGNOSIS — K219 Gastro-esophageal reflux disease without esophagitis: Secondary | ICD-10-CM | POA: Diagnosis not present

## 2020-03-30 DIAGNOSIS — Z8781 Personal history of (healed) traumatic fracture: Secondary | ICD-10-CM | POA: Diagnosis not present

## 2020-03-30 DIAGNOSIS — D649 Anemia, unspecified: Secondary | ICD-10-CM | POA: Diagnosis not present

## 2020-03-30 DIAGNOSIS — S42202A Unspecified fracture of upper end of left humerus, initial encounter for closed fracture: Secondary | ICD-10-CM | POA: Diagnosis not present

## 2020-03-30 DIAGNOSIS — E785 Hyperlipidemia, unspecified: Secondary | ICD-10-CM | POA: Diagnosis not present

## 2020-03-30 DIAGNOSIS — H543 Unqualified visual loss, both eyes: Secondary | ICD-10-CM | POA: Diagnosis not present

## 2020-03-30 DIAGNOSIS — I6200 Nontraumatic subdural hemorrhage, unspecified: Secondary | ICD-10-CM | POA: Diagnosis not present

## 2020-03-30 DIAGNOSIS — R262 Difficulty in walking, not elsewhere classified: Secondary | ICD-10-CM | POA: Diagnosis not present

## 2020-03-30 DIAGNOSIS — I209 Angina pectoris, unspecified: Secondary | ICD-10-CM | POA: Diagnosis not present

## 2020-03-30 DIAGNOSIS — G8194 Hemiplegia, unspecified affecting left nondominant side: Secondary | ICD-10-CM | POA: Diagnosis not present

## 2020-03-30 DIAGNOSIS — G4700 Insomnia, unspecified: Secondary | ICD-10-CM | POA: Diagnosis not present

## 2020-03-30 DIAGNOSIS — I501 Left ventricular failure: Secondary | ICD-10-CM | POA: Diagnosis not present

## 2020-03-30 DIAGNOSIS — J9811 Atelectasis: Secondary | ICD-10-CM | POA: Diagnosis not present

## 2020-03-30 DIAGNOSIS — H547 Unspecified visual loss: Secondary | ICD-10-CM | POA: Diagnosis not present

## 2020-03-30 DIAGNOSIS — W19XXXD Unspecified fall, subsequent encounter: Secondary | ICD-10-CM | POA: Diagnosis not present

## 2020-03-30 DIAGNOSIS — S065X9D Traumatic subdural hemorrhage with loss of consciousness of unspecified duration, subsequent encounter: Secondary | ICD-10-CM | POA: Diagnosis not present

## 2020-03-30 DIAGNOSIS — S2242XA Multiple fractures of ribs, left side, initial encounter for closed fracture: Secondary | ICD-10-CM | POA: Diagnosis not present

## 2020-03-30 DIAGNOSIS — Z9181 History of falling: Secondary | ICD-10-CM | POA: Diagnosis not present

## 2020-03-30 DIAGNOSIS — Z66 Do not resuscitate: Secondary | ICD-10-CM | POA: Diagnosis not present

## 2020-03-30 DIAGNOSIS — W19XXXA Unspecified fall, initial encounter: Secondary | ICD-10-CM | POA: Diagnosis not present

## 2020-03-30 DIAGNOSIS — I252 Old myocardial infarction: Secondary | ICD-10-CM | POA: Diagnosis not present

## 2020-03-30 DIAGNOSIS — Z9889 Other specified postprocedural states: Secondary | ICD-10-CM | POA: Diagnosis not present

## 2020-03-30 DIAGNOSIS — Z515 Encounter for palliative care: Secondary | ICD-10-CM | POA: Diagnosis not present

## 2020-03-30 DIAGNOSIS — Z7982 Long term (current) use of aspirin: Secondary | ICD-10-CM | POA: Diagnosis not present

## 2020-03-30 DIAGNOSIS — E7800 Pure hypercholesterolemia, unspecified: Secondary | ICD-10-CM | POA: Diagnosis not present

## 2020-03-30 DIAGNOSIS — I442 Atrioventricular block, complete: Secondary | ICD-10-CM | POA: Diagnosis not present

## 2020-03-30 DIAGNOSIS — M1990 Unspecified osteoarthritis, unspecified site: Secondary | ICD-10-CM | POA: Diagnosis not present

## 2020-03-30 DIAGNOSIS — S32512A Fracture of superior rim of left pubis, initial encounter for closed fracture: Secondary | ICD-10-CM | POA: Diagnosis not present

## 2020-03-30 DIAGNOSIS — S065X0D Traumatic subdural hemorrhage without loss of consciousness, subsequent encounter: Secondary | ICD-10-CM | POA: Diagnosis not present

## 2020-03-30 DIAGNOSIS — Z79899 Other long term (current) drug therapy: Secondary | ICD-10-CM | POA: Diagnosis not present

## 2020-03-30 DIAGNOSIS — R278 Other lack of coordination: Secondary | ICD-10-CM | POA: Diagnosis not present

## 2020-03-30 DIAGNOSIS — R29706 NIHSS score 6: Secondary | ICD-10-CM | POA: Diagnosis not present

## 2020-03-30 DIAGNOSIS — R29898 Other symptoms and signs involving the musculoskeletal system: Secondary | ICD-10-CM | POA: Diagnosis not present

## 2020-03-30 DIAGNOSIS — I480 Paroxysmal atrial fibrillation: Secondary | ICD-10-CM | POA: Diagnosis not present

## 2020-03-30 DIAGNOSIS — S32519D Fracture of superior rim of unspecified pubis, subsequent encounter for fracture with routine healing: Secondary | ICD-10-CM | POA: Diagnosis not present

## 2020-03-30 DIAGNOSIS — R531 Weakness: Secondary | ICD-10-CM | POA: Diagnosis not present

## 2020-03-30 DIAGNOSIS — I739 Peripheral vascular disease, unspecified: Secondary | ICD-10-CM | POA: Diagnosis not present

## 2020-03-30 DIAGNOSIS — E039 Hypothyroidism, unspecified: Secondary | ICD-10-CM | POA: Diagnosis not present

## 2020-03-30 DIAGNOSIS — R0902 Hypoxemia: Secondary | ICD-10-CM | POA: Diagnosis not present

## 2020-03-30 DIAGNOSIS — Z950 Presence of cardiac pacemaker: Secondary | ICD-10-CM | POA: Diagnosis not present

## 2020-03-30 DIAGNOSIS — M8008XD Age-related osteoporosis with current pathological fracture, vertebra(e), subsequent encounter for fracture with routine healing: Secondary | ICD-10-CM | POA: Diagnosis not present

## 2020-03-30 DIAGNOSIS — Z743 Need for continuous supervision: Secondary | ICD-10-CM | POA: Diagnosis not present

## 2020-03-30 DIAGNOSIS — M1712 Unilateral primary osteoarthritis, left knee: Secondary | ICD-10-CM | POA: Diagnosis not present

## 2020-03-30 DIAGNOSIS — M6281 Muscle weakness (generalized): Secondary | ICD-10-CM | POA: Diagnosis not present

## 2020-03-30 DIAGNOSIS — S065X0A Traumatic subdural hemorrhage without loss of consciousness, initial encounter: Secondary | ICD-10-CM | POA: Diagnosis not present

## 2020-03-30 DIAGNOSIS — I2510 Atherosclerotic heart disease of native coronary artery without angina pectoris: Secondary | ICD-10-CM | POA: Diagnosis not present

## 2020-03-30 DIAGNOSIS — H538 Other visual disturbances: Secondary | ICD-10-CM | POA: Diagnosis not present

## 2020-03-30 DIAGNOSIS — R1310 Dysphagia, unspecified: Secondary | ICD-10-CM | POA: Diagnosis not present

## 2020-03-30 DIAGNOSIS — I10 Essential (primary) hypertension: Secondary | ICD-10-CM | POA: Diagnosis not present

## 2020-03-30 DIAGNOSIS — M810 Age-related osteoporosis without current pathological fracture: Secondary | ICD-10-CM | POA: Diagnosis not present

## 2020-04-01 DIAGNOSIS — R262 Difficulty in walking, not elsewhere classified: Secondary | ICD-10-CM | POA: Diagnosis not present

## 2020-04-01 DIAGNOSIS — S42325D Nondisplaced transverse fracture of shaft of humerus, left arm, subsequent encounter for fracture with routine healing: Secondary | ICD-10-CM | POA: Diagnosis not present

## 2020-04-01 DIAGNOSIS — I6200 Nontraumatic subdural hemorrhage, unspecified: Secondary | ICD-10-CM | POA: Diagnosis not present

## 2020-04-01 DIAGNOSIS — M810 Age-related osteoporosis without current pathological fracture: Secondary | ICD-10-CM | POA: Diagnosis not present

## 2020-04-14 ENCOUNTER — Ambulatory Visit (INDEPENDENT_AMBULATORY_CARE_PROVIDER_SITE_OTHER): Payer: PPO

## 2020-04-14 DIAGNOSIS — I442 Atrioventricular block, complete: Secondary | ICD-10-CM

## 2020-04-15 DIAGNOSIS — Z9889 Other specified postprocedural states: Secondary | ICD-10-CM | POA: Diagnosis not present

## 2020-04-15 DIAGNOSIS — Z87891 Personal history of nicotine dependence: Secondary | ICD-10-CM | POA: Diagnosis not present

## 2020-04-15 DIAGNOSIS — Z9181 History of falling: Secondary | ICD-10-CM | POA: Diagnosis not present

## 2020-04-15 DIAGNOSIS — H748X1 Other specified disorders of right middle ear and mastoid: Secondary | ICD-10-CM | POA: Diagnosis not present

## 2020-04-15 DIAGNOSIS — I2510 Atherosclerotic heart disease of native coronary artery without angina pectoris: Secondary | ICD-10-CM | POA: Diagnosis not present

## 2020-04-15 DIAGNOSIS — R29898 Other symptoms and signs involving the musculoskeletal system: Secondary | ICD-10-CM | POA: Diagnosis not present

## 2020-04-15 DIAGNOSIS — I10 Essential (primary) hypertension: Secondary | ICD-10-CM | POA: Diagnosis not present

## 2020-04-15 DIAGNOSIS — Z950 Presence of cardiac pacemaker: Secondary | ICD-10-CM | POA: Diagnosis not present

## 2020-04-15 DIAGNOSIS — M1990 Unspecified osteoarthritis, unspecified site: Secondary | ICD-10-CM | POA: Diagnosis not present

## 2020-04-15 DIAGNOSIS — I252 Old myocardial infarction: Secondary | ICD-10-CM | POA: Diagnosis not present

## 2020-04-15 DIAGNOSIS — S065X9D Traumatic subdural hemorrhage with loss of consciousness of unspecified duration, subsequent encounter: Secondary | ICD-10-CM | POA: Diagnosis not present

## 2020-04-15 DIAGNOSIS — Z79899 Other long term (current) drug therapy: Secondary | ICD-10-CM | POA: Diagnosis not present

## 2020-04-15 DIAGNOSIS — W19XXXD Unspecified fall, subsequent encounter: Secondary | ICD-10-CM | POA: Diagnosis not present

## 2020-04-15 DIAGNOSIS — Z8781 Personal history of (healed) traumatic fracture: Secondary | ICD-10-CM | POA: Diagnosis not present

## 2020-04-15 DIAGNOSIS — E7800 Pure hypercholesterolemia, unspecified: Secondary | ICD-10-CM | POA: Diagnosis not present

## 2020-04-15 DIAGNOSIS — I6200 Nontraumatic subdural hemorrhage, unspecified: Secondary | ICD-10-CM | POA: Diagnosis not present

## 2020-04-15 DIAGNOSIS — I639 Cerebral infarction, unspecified: Secondary | ICD-10-CM | POA: Diagnosis not present

## 2020-04-15 DIAGNOSIS — W19XXXA Unspecified fall, initial encounter: Secondary | ICD-10-CM | POA: Diagnosis not present

## 2020-04-15 DIAGNOSIS — H543 Unqualified visual loss, both eyes: Secondary | ICD-10-CM | POA: Diagnosis not present

## 2020-04-15 DIAGNOSIS — I708 Atherosclerosis of other arteries: Secondary | ICD-10-CM | POA: Diagnosis not present

## 2020-04-15 DIAGNOSIS — R29706 NIHSS score 6: Secondary | ICD-10-CM | POA: Diagnosis not present

## 2020-04-15 DIAGNOSIS — H547 Unspecified visual loss: Secondary | ICD-10-CM | POA: Diagnosis not present

## 2020-04-15 DIAGNOSIS — E039 Hypothyroidism, unspecified: Secondary | ICD-10-CM | POA: Diagnosis not present

## 2020-04-15 DIAGNOSIS — K219 Gastro-esophageal reflux disease without esophagitis: Secondary | ICD-10-CM | POA: Diagnosis not present

## 2020-04-15 DIAGNOSIS — H538 Other visual disturbances: Secondary | ICD-10-CM | POA: Diagnosis not present

## 2020-04-15 DIAGNOSIS — M1712 Unilateral primary osteoarthritis, left knee: Secondary | ICD-10-CM | POA: Diagnosis not present

## 2020-04-15 DIAGNOSIS — S065X0A Traumatic subdural hemorrhage without loss of consciousness, initial encounter: Secondary | ICD-10-CM | POA: Diagnosis not present

## 2020-04-15 DIAGNOSIS — Z515 Encounter for palliative care: Secondary | ICD-10-CM | POA: Diagnosis not present

## 2020-04-15 DIAGNOSIS — I739 Peripheral vascular disease, unspecified: Secondary | ICD-10-CM | POA: Diagnosis not present

## 2020-04-15 DIAGNOSIS — R0902 Hypoxemia: Secondary | ICD-10-CM | POA: Diagnosis not present

## 2020-04-15 DIAGNOSIS — Z66 Do not resuscitate: Secondary | ICD-10-CM | POA: Diagnosis not present

## 2020-04-15 DIAGNOSIS — G8194 Hemiplegia, unspecified affecting left nondominant side: Secondary | ICD-10-CM | POA: Diagnosis not present

## 2020-04-15 DIAGNOSIS — R531 Weakness: Secondary | ICD-10-CM | POA: Diagnosis not present

## 2020-04-15 DIAGNOSIS — Z743 Need for continuous supervision: Secondary | ICD-10-CM | POA: Diagnosis not present

## 2020-04-15 DIAGNOSIS — J9811 Atelectasis: Secondary | ICD-10-CM | POA: Diagnosis not present

## 2020-04-15 DIAGNOSIS — G9389 Other specified disorders of brain: Secondary | ICD-10-CM | POA: Diagnosis not present

## 2020-04-15 DIAGNOSIS — Z7982 Long term (current) use of aspirin: Secondary | ICD-10-CM | POA: Diagnosis not present

## 2020-04-15 LAB — CUP PACEART REMOTE DEVICE CHECK
Battery Impedance: 3345 Ohm
Battery Remaining Longevity: 15 mo
Battery Voltage: 2.69 V
Brady Statistic AP VP Percent: 90 %
Brady Statistic AP VS Percent: 0 %
Brady Statistic AS VP Percent: 9 %
Brady Statistic AS VS Percent: 0 %
Date Time Interrogation Session: 20220217133105
Implantable Lead Implant Date: 20120906
Implantable Lead Implant Date: 20120906
Implantable Lead Location: 753859
Implantable Lead Location: 753860
Implantable Lead Model: 4076
Implantable Lead Model: 5076
Implantable Pulse Generator Implant Date: 20120906
Lead Channel Impedance Value: 476 Ohm
Lead Channel Impedance Value: 495 Ohm
Lead Channel Pacing Threshold Amplitude: 0.375 V
Lead Channel Pacing Threshold Amplitude: 0.75 V
Lead Channel Pacing Threshold Pulse Width: 0.4 ms
Lead Channel Pacing Threshold Pulse Width: 0.4 ms
Lead Channel Setting Pacing Amplitude: 1.5 V
Lead Channel Setting Pacing Amplitude: 2.5 V
Lead Channel Setting Pacing Pulse Width: 0.4 ms
Lead Channel Setting Sensing Sensitivity: 4 mV

## 2020-04-20 NOTE — Progress Notes (Signed)
Remote pacemaker transmission.   

## 2020-04-25 ENCOUNTER — Encounter: Payer: PPO | Admitting: Cardiology

## 2020-04-25 NOTE — Progress Notes (Deleted)
Electrophysiology Office Note   Date:  04/25/2020   ID:  Maria, Schmidt 06-29-1919, MRN [id number]  PCP:  Ronita Hipps, MD  Cardiologist:  Bettina Gavia Primary Electrophysiologist:  Ceria Suminski Meredith Leeds, MD    No chief complaint on file.    History of Present Illness: Maria Schmidt is a 87 y.o. female who is being seen today for the evaluation of sick sinus syndrome at the request of Ronita Hipps, MD. Presenting today for electrophysiology evaluation.  She has a history significant for sick sinus syndrome status post Medtronic dual-chamber pacemaker, hypertension, hyperlipidemia, and coronary artery disease.  Today, denies symptoms of palpitations, chest pain, shortness of breath, orthopnea, PND, lower extremity edema, claudication, dizziness, presyncope, syncope, bleeding, or neurologic sequela. The patient is tolerating medications without difficulties. ***   Past Medical History:  Diagnosis Date  . Coronary artery disease   . History of second degree heart block   . Hyperlipidemia   . Hypertension   . Hypothyroidism    Past Surgical History:  Procedure Laterality Date  . ABDOMINAL HYSTERECTOMY    . BREAST LUMPECTOMY    . CARDIAC CATHETERIZATION    . CHOLECYSTECTOMY    . INSERT / REPLACE / REMOVE PACEMAKER    . Stent to LAD  2011     Current Outpatient Medications  Medication Sig Dispense Refill  . acebutolol (SECTRAL) 200 MG capsule Take 1 capsule (200 mg total) by mouth at bedtime. 90 capsule 3  . acetaminophen (TYLENOL) 500 MG tablet Take 500 mg by mouth as needed.    . ASPIRIN 81 PO Take by mouth daily.    . isosorbide mononitrate (IMDUR) 30 MG 24 hr tablet Take 1 tablet (30 mg total) by mouth daily. 90 tablet 3  . levothyroxine (SYNTHROID, LEVOTHROID) 50 MCG tablet Take 50 mcg by mouth daily.  3  . Multiple Vitamin (MULTIVITAMIN) tablet Take 1 tablet by mouth daily.    Marland Kitchen NIFEdipine (ADALAT CC) 30 MG 24 hr tablet Take 30 mg by mouth daily.    . nitroGLYCERIN  (NITROSTAT) 0.4 MG SL tablet Place 1 tablet (0.4 mg total) under the tongue every 5 (five) minutes as needed for chest pain. 25 tablet 5  . pantoprazole (PROTONIX) 40 MG tablet     . ranolazine (RANEXA) 500 MG 12 hr tablet TAKE 1 TABLET BY MOUTH EVERY OTHER DAY 60 tablet 6   No current facility-administered medications for this visit.    Allergies:   Lipitor [atorvastatin], Benazepril hcl, Cholestyramine, Cimetidine, Gabapentin, Lovastatin, Pravastatin sodium, Simvastatin, Sucrose, and Telmisartan   Social History:  The patient  reports that she has never smoked. She has never used smokeless tobacco. She reports that she does not drink alcohol and does not use drugs.   Family History:  The patient's family history includes Cancer in her father; Heart attack in her father.    ROS:  Please see the history of present illness.   Otherwise, review of systems is positive for none.   All other systems are reviewed and negative.   PHYSICAL EXAM: VS:  There were no vitals taken for this visit. , BMI There is no height or weight on file to calculate BMI. GEN: Well nourished, well developed, in no acute distress  HEENT: normal  Neck: no JVD, carotid bruits, or masses Cardiac: ***RRR; no murmurs, rubs, or gallops,no edema  Respiratory:  clear to auscultation bilaterally, normal work of breathing GI: soft, nontender, nondistended, + BS MS: no deformity or  atrophy  Skin: warm and dry, device site well healed Neuro:  Strength and sensation are intact Psych: euthymic mood, full affect  EKG:  EKG {ACTION; IS/IS [id number]} ordered today. Personal review of the ekg ordered *** shows ***  Personal review of the device interrogation today. Results in [place]: No results found for requested labs within last 8760 hours.    Lipid Panel  No results found for: CHOL, TRIG, HDL, CHOLHDL, VLDL, LDLCALC, LDLDIRECT   Wt Readings from Last 3 Encounters:  10/28/19 120 lb 9.6 oz (54.7 kg)   03/30/19 123 lb 6.4 oz (56 kg)  03/26/19 123 lb (55.8 kg)      Other studies Reviewed: Additional studies/ records that were reviewed today include: TTE 07/03/2017 Review of the above records today demonstrates:  mild global LV systolic dysfunction No significant Doppler findings. No significant valvular abnormalities. Ejection fraction 40 to 45%   ASSESSMENT AND PLAN:  1.  Sick sinus syndrome: Status post Medtronic dual-chamber pacemaker.  Device functioning appropriately.  No changes at this time.  2.  Coronary artery disease: No current chest pain.  Plan per primary cardiology.  3.  Hypertension: ***   Current medicines are reviewed at length with the patient today.   The patient does not have concerns regarding her medicines.  The following changes were made today: ***  Labs/ tests ordered today include:  No orders of the defined types were placed in this encounter.    Disposition:   FU with Tarron Krolak *** year  Signed, Thurl Boen Meredith Leeds, MD  04/25/2020 9:14 AM     [facility] [address] [place] [address] [phone] (office) [phone] (fax)

## 2020-05-02 DIAGNOSIS — E785 Hyperlipidemia, unspecified: Secondary | ICD-10-CM | POA: Insufficient documentation

## 2020-05-02 DIAGNOSIS — I10 Essential (primary) hypertension: Secondary | ICD-10-CM | POA: Insufficient documentation

## 2020-05-02 DIAGNOSIS — Z8679 Personal history of other diseases of the circulatory system: Secondary | ICD-10-CM | POA: Insufficient documentation

## 2020-05-02 DIAGNOSIS — I2510 Atherosclerotic heart disease of native coronary artery without angina pectoris: Secondary | ICD-10-CM | POA: Insufficient documentation

## 2020-05-04 ENCOUNTER — Ambulatory Visit: Payer: PPO | Admitting: Cardiology

## 2020-05-04 NOTE — Progress Notes (Deleted)
Cardiology Office Note:    Date:  05/04/2020   ID:  Maria Schmidt, DOB 1919/10/17, MRN [id number]  PCP:  Ronita Hipps, MD  Cardiologist:  Shirlee More, MD    Referring MD: Ronita Hipps, MD    ASSESSMENT:    No diagnosis found. PLAN:    In order of problems listed above:  1. ***   Next appointment: ***   Medication Adjustments/Labs and Tests Ordered: Current medicines are reviewed at length with the patient today.  Concerns regarding medicines are outlined above.  No orders of the defined types were placed in this encounter.  No orders of the defined types were placed in this encounter.   No chief complaint on file.   History of Present Illness:    [name] is a 87 y.o. female with a hx of DM with bare-metal stent to LAD 2011 dyslipidemia and hypertension with sick sinus syndrome and a [employer] pacemaker as well as atrial fibrillation and a previous GI bleed without anticoagulation.  She was last seen 10/28/2019. Compliance with diet, lifestyle and medications: ***  She was seen 03/26/2020 [facility] ED admitted discharge 03/30/2020 following a fall sustaining injuries with a left humerus fracture pelvic fracture rib fracture 11-12 and subdural hematoma with intraventricular hemorrhage and subarachnoid hemorrhage.  She is seen at [facility] ED 04/15/2020 with generalized weakness CBC was normal potassium 4.0 creatinine 0.80 BNP level 2730 her EKG showed normal dual-chamber paced rhythm she is admitted to the hospital with concern of progression of her subdural hematoma and conservative care was chosen by the patient and family to hospice. Past Medical History:  Diagnosis Date  . Coronary artery disease   . Coronary artery disease involving native coronary artery of native heart 06/19/2015   Overview:  BMS to LAD 2011, EF 70%  . Essential hypertension 08/29/2017  . GI bleed 08/27/2017  . Heart block atrioventricular 08/08/2016  . History of second  degree heart block   . Hyperlipidemia   . Hyperlipidemia LDL goal <100 06/19/2015   Overview:  She is i ntolerant of statins and declines drug therapy  . Hypertension   . Hypothyroidism   . Pacemaker 07/17/2017  . Pacemaker reprogramming/check 05/19/2015  . SSS (sick sinus syndrome) ([place]) 05/19/2015  . Statin intolerance 03/04/2018  . Syncope and collapse 05/19/2015    Past Surgical History:  Procedure Laterality Date  . ABDOMINAL HYSTERECTOMY    . BREAST LUMPECTOMY    . CARDIAC CATHETERIZATION    . CHOLECYSTECTOMY    . INSERT / REPLACE / REMOVE PACEMAKER    . Stent to LAD  2011    Current Medications: No outpatient medications have been marked as taking for the 05/04/20 encounter (Appointment) with Richardo Priest, MD.     Allergies:   Lipitor [atorvastatin], Benazepril hcl, Cholestyramine, Cimetidine, Gabapentin, Lovastatin, Pravastatin sodium, Simvastatin, Sucrose, and Telmisartan   Social History   Socioeconomic History  . Marital status: Widowed    Spouse name: Not on file  . Number of children: Not on file  . Years of education: Not on file  . Highest education level: Not on file  Occupational History  . Not on file  Tobacco Use  . Smoking status: Never Smoker  . Smokeless tobacco: Never Used  Vaping Use  . Vaping Use: Never used  Substance and Sexual Activity  . Alcohol use: Never  . Drug use: Never  . Sexual activity: Not Currently  Other Topics Concern  .  Not on file  Social History Narrative  . Not on file   Social Determinants of Health   Financial Resource Strain: Not on file  Food Insecurity: Not on file  Transportation Needs: Not on file  Physical Activity: Not on file  Stress: Not on file  Social Connections: Not on file     Family History: The patient's ***family history includes Cancer in her father; Heart attack in her father. ROS:   Please see the history of present illness.    All other systems reviewed and are negative.  EKGs/Labs/Other  Studies Reviewed:    The following studies were reviewed today:  EKG:  EKG ordered today and personally reviewed.  The ekg ordered today demonstrates ***  Recent Labs: No results found for requested labs within last 8760 hours.  Recent Lipid Panel No results found for: CHOL, TRIG, HDL, CHOLHDL, VLDL, LDLCALC, LDLDIRECT  Physical Exam:    VS:  There were no vitals taken for this visit.    Wt Readings from Last 3 Encounters:  10/28/19 120 lb 9.6 oz (54.7 kg)  03/30/19 123 lb 6.4 oz (56 kg)  03/26/19 123 lb (55.8 kg)     GEN: *** Well nourished, well developed in no acute distress HEENT: Normal NECK: No JVD; No carotid bruits LYMPHATICS: No lymphadenopathy CARDIAC: ***RRR, no murmurs, rubs, gallops RESPIRATORY:  Clear to auscultation without rales, wheezing or rhonchi  ABDOMEN: Soft, non-tender, non-distended MUSCULOSKELETAL:  No edema; No deformity  SKIN: Warm and dry NEUROLOGIC:  Alert and oriented x 3 PSYCHIATRIC:  Normal affect    Signed, Shirlee More, MD  05/04/2020 12:18 PM    [facility]

## 2020-05-06 ENCOUNTER — Telehealth: Payer: Self-pay

## 2020-05-06 NOTE — Telephone Encounter (Signed)
The pt son states she passed away 05-25-20. I sent my condolences and let him know I will call [employer] and order a return kit. I told him he should have it in 7-10 business days.

## 2020-05-27 DEATH — deceased
